# Patient Record
Sex: Female | Born: 1995 | Race: Black or African American | Hispanic: No | Marital: Single | Smoking: Never smoker
Health system: Southern US, Community
[De-identification: ages and names within clinical notes are randomized; demographics above are authoritative.]

## PROBLEM LIST (undated history)

## (undated) ENCOUNTER — Inpatient Hospital Stay (HOSPITAL_COMMUNITY): Payer: Self-pay

## (undated) DIAGNOSIS — D573 Sickle-cell trait: Secondary | ICD-10-CM

## (undated) HISTORY — PX: KNEE SURGERY: SHX244

---
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
## 2013-03-25 ENCOUNTER — Emergency Department (HOSPITAL_COMMUNITY): Payer: BC Managed Care – PPO

## 2013-03-25 ENCOUNTER — Emergency Department (HOSPITAL_COMMUNITY)
Admission: EM | Admit: 2013-03-25 | Discharge: 2013-03-25 | Disposition: A | Discharge status: Home / Self Care | Payer: BC Managed Care – PPO | Attending: Emergency Medicine | Admitting: Emergency Medicine

## 2013-03-25 ENCOUNTER — Encounter (HOSPITAL_COMMUNITY): Payer: Self-pay | Admitting: Family Medicine

## 2013-03-25 DIAGNOSIS — O9989 Other specified diseases and conditions complicating pregnancy, childbirth and the puerperium: Secondary | ICD-10-CM | POA: Insufficient documentation

## 2013-03-25 DIAGNOSIS — Z79899 Other long term (current) drug therapy: Secondary | ICD-10-CM | POA: Insufficient documentation

## 2013-03-25 DIAGNOSIS — R110 Nausea: Secondary | ICD-10-CM | POA: Insufficient documentation

## 2013-03-25 DIAGNOSIS — N9489 Other specified conditions associated with female genital organs and menstrual cycle: Secondary | ICD-10-CM

## 2013-03-25 DIAGNOSIS — N72 Inflammatory disease of cervix uteri: Secondary | ICD-10-CM

## 2013-03-25 DIAGNOSIS — R109 Unspecified abdominal pain: Secondary | ICD-10-CM | POA: Insufficient documentation

## 2013-03-25 DIAGNOSIS — Z3201 Encounter for pregnancy test, result positive: Secondary | ICD-10-CM

## 2013-03-25 DIAGNOSIS — O2390 Unspecified genitourinary tract infection in pregnancy, unspecified trimester: Secondary | ICD-10-CM | POA: Insufficient documentation

## 2013-03-25 LAB — BASIC METABOLIC PANEL
Chloride: 101 mEq/L (ref 96–112)
GFR calc Af Amer: >90 mL/min (ref >90)
Potassium: 3.2 mEq/L — ABNORMAL LOW (ref 3.5–5.1)
Sodium: 134 mEq/L — ABNORMAL LOW (ref 135–145)

## 2013-03-25 LAB — URINALYSIS W MICROSCOPIC + REFLEX CULTURE
Leukocytes, UA: NEGATIVE
Nitrite: NEGATIVE
Specific Gravity, Urine: 1.021 (ref 1.005–1.030)
pH: 6 (ref 5.0–8.0)

## 2013-03-25 LAB — CBC WITH DIFFERENTIAL/PLATELET
Basophils Relative: 0 % (ref 0–1)
Eosinophils Relative: 1 % (ref 0–5)
HCT: 33.8 % — ABNORMAL LOW (ref 36.0–46.0)
Hemoglobin: 12.1 g/dL (ref 12.0–15.0)
Lymphocytes Relative: 41 % (ref 12–46)
MCHC: 35.8 g/dL (ref 30.0–36.0)
MCV: 81.1 fL (ref 78.0–100.0)
Monocytes Absolute: 0.5 10*3/uL (ref 0.1–1.0)
Monocytes Relative: 9 % (ref 3–12)
Neutro Abs: 3.2 10*3/uL (ref 1.7–7.7)

## 2013-03-25 LAB — HCG, QUANTITATIVE, PREGNANCY: hCG, Beta Chain, Quant, S: 400 m[IU]/mL — ABNORMAL HIGH (ref <5)

## 2013-03-25 LAB — POCT PREGNANCY, URINE: Preg Test, Ur: POSITIVE — AB

## 2013-03-25 LAB — WET PREP, GENITAL

## 2013-03-25 MED ORDER — LIDOCAINE HCL 1 % IJ SOLN
INTRAMUSCULAR | Status: AC
  Administered 2013-03-25: 0.9 mL
  Filled 2013-03-25: qty 20

## 2013-03-25 MED ORDER — ONDANSETRON 8 MG PO TBDP
8.0000 mg | ORAL_TABLET | Freq: Once | ORAL | Status: AC
  Administered 2013-03-25: 8 mg via ORAL
  Filled 2013-03-25: qty 1

## 2013-03-25 MED ORDER — CEFTRIAXONE SODIUM 250 MG IJ SOLR
250.0000 mg | Freq: Once | INTRAMUSCULAR | Status: AC
  Administered 2013-03-25: 250 mg via INTRAMUSCULAR
  Filled 2013-03-25: qty 250

## 2013-03-25 MED ORDER — ACETAMINOPHEN 325 MG PO TABS
650.0000 mg | ORAL_TABLET | Freq: Once | ORAL | Status: AC
  Administered 2013-03-25: 650 mg via ORAL
  Filled 2013-03-25: qty 2

## 2013-03-25 MED ORDER — AZITHROMYCIN 250 MG PO TABS
1000.0000 mg | ORAL_TABLET | Freq: Once | ORAL | Status: AC
  Administered 2013-03-25: 1000 mg via ORAL
  Filled 2013-03-25: qty 4

## 2013-03-25 NOTE — ED Notes (Addendum)
Patient states that she has had abdominal pain since 5am. Reports nausea; denies vomiting, diarrhea or fever. Patient states that she went to the health center yesterday at school and found out that she is pregnant. Denies vaginal discharge.

## 2013-03-25 NOTE — ED Provider Notes (Signed)
CSN: [id number]     Arrival date & time 03/25/13  0550 History   First MD Initiated Contact with Patient 03/25/13 [phone]     Chief Complaint  Patient presents with  . Abdominal Pain   (Consider location/radiation/quality/duration/timing/severity/associated sxs/prior Treatment) HPI Patient is a 17 year old female who presents to emergency department complaining of lower abdominal pain. Patient states she had intermittent cramping for the last week however today around 5 AM pain became worse. Patient states that she went to a health department yesterday because she missed her period and she was told that she's pregnant. Patient denies any prior pregnancies. Patient denies taking any medications for this. Patient denies any vaginal discharge or bleeding. Patient denies any fever, chills, malaise, urinary symptoms. Patient states that she has been nauseated for last several days but denies any vomiting or diarrhea. History reviewed. No pertinent past medical history. Past Surgical History  Procedure Laterality Date  . Knee surgery     No family history on file. History  Substance Use Topics  . Smoking status: Never Smoker   . Smokeless tobacco: Not on file  . Alcohol Use: Yes     Comment: Occasional   OB History   Grav Para Term Preterm Abortions TAB SAB Ect Mult Living                 Review of Systems  Constitutional: Negative for fever and chills.  HENT: Negative for neck pain and neck stiffness.   Respiratory: Negative for cough, chest tightness and shortness of breath.   Cardiovascular: Negative for chest pain, palpitations and leg swelling.  Gastrointestinal: Positive for nausea and abdominal pain. Negative for vomiting and diarrhea.  Genitourinary: Positive for pelvic pain. Negative for dysuria, flank pain, vaginal bleeding, vaginal discharge and vaginal pain.  Musculoskeletal: Negative for myalgias and arthralgias.  Skin: Negative for rash.  Neurological: Negative for  dizziness, weakness and headaches.  All other systems reviewed and are negative.    Allergies  Review of patient's allergies indicates no known allergies.  Home Medications   Current Outpatient Rx  Name  Route  Sig  Dispense  Refill  . norgestimate-ethinyl estradiol (ORTHO-CYCLEN,SPRINTEC,PREVIFEM) 0.25-35 MG-MCG tablet   Oral   Take 1 tablet by mouth daily.          BP 124/67  Pulse 95  Temp(Src) 98.4 F (36.9 C) (Oral)  Resp 22  SpO2 100%  LMP 02/15/2013 Physical Exam  Nursing note and vitals reviewed. Constitutional: She is oriented to person, place, and time. She appears well-developed and well-nourished.  HENT:  Head: Normocephalic.  Eyes: Conjunctivae are normal.  Neck: Neck supple.  Cardiovascular: Normal rate, regular rhythm and normal heart sounds.   Pulmonary/Chest: Effort normal and breath sounds normal. No respiratory distress. She has no wheezes. She has no rales.  Abdominal: Soft. Bowel sounds are normal. She exhibits no distension. There is tenderness. There is no rebound.  Suprapubic tenderness  Genitourinary:  Normal external genitalia. White thin vaginal discharge. Cervix is erythematous, closed. CMT present. Left adnexal tenderness.   Neurological: She is alert and oriented to person, place, and time.  Skin: Skin is warm and dry.    ED Course  Procedures (including critical care time)  Medications  acetaminophen (TYLENOL) tablet 650 mg (650 mg Oral Given 03/25/13 0656)  ondansetron (ZOFRAN-ODT) disintegrating tablet 8 mg (8 mg Oral Given 03/25/13 0656)  cefTRIAXone (ROCEPHIN) injection 250 mg (250 mg Intramuscular Given 03/25/13 1100)  azithromycin (ZITHROMAX) tablet 1,000 mg (1,000 mg Oral  Given 03/25/13 1100)  lidocaine (XYLOCAINE) 1 % (with pres) injection (0.9 mLs  Given 03/25/13 1101)   Results for orders placed during the hospital encounter of 03/25/13  WET PREP, GENITAL      Result Value Range   Yeast Wet Prep HPF POC NONE SEEN  NONE SEEN    Trich, Wet Prep NONE SEEN  NONE SEEN   Clue Cells Wet Prep HPF POC NONE SEEN  NONE SEEN   WBC, Wet Prep HPF POC MODERATE (*) NONE SEEN  CBC WITH DIFFERENTIAL      Result Value Range   WBC 6.3  4.0 - 10.5 K/uL   RBC 4.17  3.87 - 5.11 MIL/uL   Hemoglobin 12.1  12.0 - 15.0 g/dL   HCT 10.2 (*) 72.5 - 36.6 %   MCV 81.1  78.0 - 100.0 fL   MCH 29.0  26.0 - 34.0 pg   MCHC 35.8  30.0 - 36.0 g/dL   RDW 44.0  34.7 - 42.5 %   Platelets 229  150 - 400 K/uL   Neutrophils Relative % 50  43 - 77 %   Neutro Abs 3.2  1.7 - 7.7 K/uL   Lymphocytes Relative 41  12 - 46 %   Lymphs Abs 2.6  0.7 - 4.0 K/uL   Monocytes Relative 9  3 - 12 %   Monocytes Absolute 0.5  0.1 - 1.0 K/uL   Eosinophils Relative 1  0 - 5 %   Eosinophils Absolute 0.0  0.0 - 0.7 K/uL   Basophils Relative 0  0 - 1 %   Basophils Absolute 0.0  0.0 - 0.1 K/uL  HCG, QUANTITATIVE, PREGNANCY      Result Value Range   hCG, Beta Chain, Quant, S 400 (*) <5 mIU/mL  URINALYSIS W MICROSCOPIC + REFLEX CULTURE      Result Value Range   Color, Urine YELLOW  YELLOW   APPearance CLEAR  CLEAR   Specific Gravity, Urine 1.021  1.005 - 1.030   pH 6.0  5.0 - 8.0   Glucose, UA NEGATIVE  NEGATIVE mg/dL   Hgb urine dipstick NEGATIVE  NEGATIVE   Bilirubin Urine NEGATIVE  NEGATIVE   Ketones, ur NEGATIVE  NEGATIVE mg/dL   Protein, ur NEGATIVE  NEGATIVE mg/dL   Urobilinogen, UA 1.0  0.0 - 1.0 mg/dL   Nitrite NEGATIVE  NEGATIVE   Leukocytes, UA NEGATIVE  NEGATIVE   WBC, UA 0-2  <3 WBC/hpf   RBC / HPF 0-2  <3 RBC/hpf   Bacteria, UA RARE  RARE   Squamous Epithelial / LPF RARE  RARE  BASIC METABOLIC PANEL      Result Value Range   Sodium 134 (*) 135 - 145 mEq/L   Potassium 3.2 (*) 3.5 - 5.1 mEq/L   Chloride 101  96 - 112 mEq/L   CO2 23  19 - 32 mEq/L   Glucose, Bld 101 (*) 70 - 99 mg/dL   BUN 8  6 - 23 mg/dL   Creatinine, Ser 9.56  0.50 - 1.10 mg/dL   Calcium 8.9  8.4 - 38.7 mg/dL   GFR calc non Af Amer >90  >90 mL/min   GFR calc Af Amer >90   >90 mL/min  POCT PREGNANCY, URINE      Result Value Range   Preg Test, Ur POSITIVE (*) NEGATIVE  ABO/RH      Result Value Range   ABO/RH(D) A POS     No rh immune globuloin NOT A RH  IMMUNE GLOBULIN CANDIDATE, PT RH POSITIVE     US Ob Transvaginal  03/25/2013   *RADIOLOGY REPORT*  Clinical Data: Patient with abdominal pain.  Rule out ectopic pregnancy.  OBSTETRIC <14 WK ULTRASOUND  Technique:  Transabdominal ultrasound was performed for evaluation of the gestation as well as the maternal uterus and adnexal regions.  Comparison:  None  Findings: No intrauterine pregnancy is identified.  There is mixed echogenicity material demonstrated within the endometrial cavity, suggestive of a small amount of blood products.  There is a small to moderate amount of complicated fluid within the pelvis.  No definite fluid extending up to the right or left upper quadrants.  The left ovary measures 3.3 x 1.6 x 3.0 cm.  There is a probable left corpus luteum which measures 2.2 x 2.4 x 1.3 cm.  The right ovary measures 2.6 x 1.1 x 1.1 cm.  Within the left adnexa there is a 4.2 x 1.8 cm heterogenous mass with a somewhat tubular-shape which appears to be separate from the left ovary.  IMPRESSION:  1. Suggestion of a complex mass within the left adnexa with a small to moderate amount of free fluid in the pelvis.  These findings are suspicious for ectopic pregnancy.  No intrauterine pregnancy is identified.  2. Mixed echogenicity material within endometrial canal which may represent blood products.  These results were called by telephone on 03/25/2013 at 10:00 a.m. to [facility] [place], who verbally acknowledged these results.   Original Report Authenticated By: Annia Belt, M.D    11:04 AM Spoke with Dr. Debroah Loop from [facility]. Advised given result findings, recheck hcg in 2 days. Verified to make sure pt does not need to be seen emergently seen by GYN or admitted. Dr. Debroah Loop stated she does not need to be seen for 2  days unless symptoms worsening or any new concerning symptoms.    MDM   1. Positive pregnancy test   2. Adnexal mass   3. Cervicitis     Pt with positive pregnancy test, suprapubic cramping and left adnexal pain on exam. US obtained to r/o ectopic and showed a complex mass within left adnexa, no intrauterine pregnancy see. This is suspicious for possible ectopic pregnancy, however given her hCG is only 400 this is not definite. Patient was given Tylenol for her pain in the ER. Patient is completely pain-free at this time. She had no vaginal bleeding on exam. She did have a friable cervix with positive cervical motion tenderness. I have covered her with Rocephin 250 mg IM and Zithromax 1 g by mouth per Dr. Olivia Mackie recommendation. I discussed patient and on the results findings with Dr. Debroah Loop who is the OB/GYN on call who recommended patient be discharged home with followup in 2 days for hCG recheck. Given patient's has normal vital signs at this time, no pain, she will be discharged home with Tylenol for pain and continue prenatal vitamins. She is [facility] for recheck of her hCG. If she has an increased pain, vaginal bleeding, dizziness, increased pain, or any new concerning symptoms she is to go to [facility] hospital. Patient voiced understanding.  Filed Vitals:   03/25/13 0554  BP: 124/67  Pulse: 95  Temp: 98.4 F (36.9 C)  TempSrc: Oral  Resp: 22  SpO2: 100%       Finbar Nippert A Piya Mesch, PA-C 03/25/13 1612

## 2013-03-26 NOTE — ED Provider Notes (Signed)
Medical screening examination/treatment/procedure(s) were performed by non-physician practitioner and as supervising physician I was immediately available for consultation/collaboration.   Ezme Duch, MD 03/26/13 1359 

## 2013-03-27 ENCOUNTER — Encounter (HOSPITAL_COMMUNITY): Payer: Self-pay

## 2013-03-27 ENCOUNTER — Inpatient Hospital Stay (HOSPITAL_COMMUNITY)
Admission: AD | Admit: 2013-03-27 | Discharge: 2013-03-27 | Disposition: A | Discharge status: Home / Self Care | Payer: BC Managed Care – PPO | Source: Ambulatory Visit | Attending: Obstetrics & Gynecology | Admitting: Obstetrics & Gynecology

## 2013-03-27 DIAGNOSIS — O039 Complete or unspecified spontaneous abortion without complication: Secondary | ICD-10-CM

## 2013-03-27 LAB — HCG, QUANTITATIVE, PREGNANCY: hCG, Beta Chain, Quant, S: 108 m[IU]/mL — ABNORMAL HIGH (ref <5)

## 2013-03-27 NOTE — MAU Provider Note (Signed)
S: Pt was seen at [facility]  on 03/25/13 for ? Ectopic pregnancy. She did have pain, no bleeding. Dr Debroah Loop was called advised to follow BHCG down. She denies any pain/ bleeding today.  O: Alert, oriented, NAD  Labs 9/12 BHCG 400          9/14 BHCG 108  A: SAB  P: Spoke with Dr Despina Hidden who advised no ultrasound necessary, just follow down quants closely. She will return Tuesday for repeat quant

## 2013-03-27 NOTE — MAU Note (Signed)
Patient to MAU for follow up BHCG. Patient was seen at [facility] on 9-12 and instructed to return to MAU. Denies pain or bleeding.

## 2013-03-29 ENCOUNTER — Inpatient Hospital Stay (HOSPITAL_COMMUNITY)
Admission: AD | Admit: 2013-03-29 | Discharge: 2013-03-29 | Disposition: A | Discharge status: Home / Self Care | Payer: BC Managed Care – PPO | Source: Ambulatory Visit | Attending: Obstetrics & Gynecology | Admitting: Obstetrics & Gynecology

## 2013-03-29 DIAGNOSIS — O00109 Unspecified tubal pregnancy without intrauterine pregnancy: Secondary | ICD-10-CM | POA: Insufficient documentation

## 2013-03-29 DIAGNOSIS — O009 Unspecified ectopic pregnancy without intrauterine pregnancy: Secondary | ICD-10-CM

## 2013-03-29 DIAGNOSIS — R109 Unspecified abdominal pain: Secondary | ICD-10-CM | POA: Insufficient documentation

## 2013-03-29 MED ORDER — IBUPROFEN 600 MG PO TABS
600.0000 mg | ORAL_TABLET | Freq: Once | ORAL | Status: AC
  Administered 2013-03-29: 600 mg via ORAL
  Filled 2013-03-29 (×2): qty 1

## 2013-03-29 MED ORDER — IBUPROFEN 600 MG PO TABS: 600.0000 mg | ORAL_TABLET | Freq: Once | ORAL | Status: DC

## 2013-03-29 MED ORDER — IBUPROFEN 600 MG PO TABS: 600.0000 mg | ORAL_TABLET | Freq: Four times a day (QID) | ORAL | Status: DC | PRN

## 2013-03-29 NOTE — MAU Provider Note (Signed)
History   Chief Complaint:  Follow-up  Pt reports continued bleeding, has used 4 overnight pads today. Also reports cramping that is a little worse than her normal period cramps   Tammie Mckee is  17 y.o. G1P0 Patient's last menstrual period was 02/15/2013.Marland Kitchen Patient is here for follow up of quantitative HCG and ongoing surveillance of pregnancy status. She was diagnosed with a left ectopic pregnancy 03/25/2013 by ultrasound and has had a drop in hCGs spontaneously.    Since her last visit, the patient is with new complaint.   The patient reports bleeding as  similar to period.  Mild to moderate cramping but did not resolve with Tylenol.  General ROS: Denies fever, chills, dizziness, weakness.   Her previous Quantitative HCG values are:  Results for Tammie, Mckee (MRN [id number]) as of 03/29/2013 20:34  Ref. Range 03/25/2013 07:05 03/27/2013 12:05  hCG, Beta Chain, Quant, S Latest Range: <5 mIU/mL 400 (H) 108 (H)     Physical Exam   Blood pressure 132/79, pulse 59, temperature 98.1 F (36.7 C), temperature source Oral, resp. rate 16, last menstrual period 02/15/2013, SpO2 100.00%.  Focused Gynecological Exam: exam declined by the patient. Patient is in no apparent distress.  Labs: Results for orders placed during the hospital encounter of 03/29/13 (from the past 24 hour(s))  HCG, QUANTITATIVE, PREGNANCY   Collection Time    03/29/13  7:50 PM      Result Value Range   hCG, Beta Chain, Quant, S 46 (*) <5 mIU/mL    Ultrasound Studies:   N/A   Assessment: Spontaneously resolving left ectopic pregnancy with appropriately drop in hCGs.   Plan: The patient is instructed to follow up for weekly hCGs at [facility] hospital outpatient clinic until less than 2 and in 2 weeks for a provider visit followup appointment. Ectopic precautions. Support given.   Medication List         ibuprofen 600 MG tablet  Commonly known as:  ADVIL,MOTRIN  Take 1 tablet (600 mg total) by mouth  every 6 (six) hours as needed for pain.     ibuprofen 600 MG tablet  Commonly known as:  ADVIL,MOTRIN  Take 1 tablet (600 mg total) by mouth once.     norgestimate-ethinyl estradiol 0.25-35 MG-MCG tablet  Commonly known as:  ORTHO-CYCLEN,SPRINTEC,PREVIFEM  Take 1 tablet by mouth daily.       Tammie Mckee 03/29/2013, 8:59 PM

## 2013-03-29 NOTE — MAU Note (Signed)
Pt reports continued bleeding, has used 4 overnight pads today. Also reports cramping that is a little worse than her normal period cramps.

## 2013-04-05 ENCOUNTER — Other Ambulatory Visit: Payer: BC Managed Care – PPO

## 2013-04-05 DIAGNOSIS — O009 Unspecified ectopic pregnancy without intrauterine pregnancy: Secondary | ICD-10-CM

## 2013-04-09 NOTE — MAU Provider Note (Signed)
Attestation of Attending Supervision of Advanced Practitioner (CNM/NP): Evaluation and management procedures were performed by the Advanced Practitioner under my supervision and collaboration. I have reviewed the Advanced Practitioner's note and chart, and I agree with the management and plan.  Tammie Capri H. 6:12 PM

## 2013-04-13 ENCOUNTER — Encounter: Payer: BC Managed Care – PPO | Admitting: Nurse Practitioner

## 2013-04-18 ENCOUNTER — Ambulatory Visit (INDEPENDENT_AMBULATORY_CARE_PROVIDER_SITE_OTHER): Payer: BC Managed Care – PPO | Admitting: Obstetrics & Gynecology

## 2013-04-18 ENCOUNTER — Encounter: Payer: Self-pay | Admitting: Nurse Practitioner

## 2013-04-18 VITALS — BP 119/63 | HR 63 | Temp 96.9°F | Ht 63.0 in | Wt 177.0 lb

## 2013-04-18 DIAGNOSIS — O009 Unspecified ectopic pregnancy without intrauterine pregnancy: Secondary | ICD-10-CM

## 2013-04-18 MED ORDER — NORGESTIM-ETH ESTRAD TRIPHASIC 0.18/0.215/0.25 MG-35 MCG PO TABS: 1.0000 | ORAL_TABLET | Freq: Every day | ORAL | Status: DC

## 2013-04-18 NOTE — Progress Notes (Signed)
  Subjective:    Patient ID: Tammie Mckee, female    DOB: 13-Nov-1995, 17 y.o.   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 She has no complaints and her QBHCG is now less than 2. We discussed contraception. She uses condoms faithfully and would like to restart ortho tri cyclen.  Review of Systems     Objective:   Physical Exam        Assessment & Plan:  As above

## 2013-04-18 NOTE — Patient Instructions (Signed)

## 2013-10-08 ENCOUNTER — Emergency Department (HOSPITAL_COMMUNITY)
Admission: EM | Admit: 2013-10-08 | Discharge: 2013-10-08 | Disposition: A | Discharge status: Home / Self Care | Payer: BC Managed Care – PPO | Attending: Emergency Medicine | Admitting: Emergency Medicine

## 2013-10-08 ENCOUNTER — Encounter (HOSPITAL_COMMUNITY): Payer: Self-pay | Admitting: Emergency Medicine

## 2013-10-08 DIAGNOSIS — K3189 Other diseases of stomach and duodenum: Secondary | ICD-10-CM | POA: Insufficient documentation

## 2013-10-08 DIAGNOSIS — Z79899 Other long term (current) drug therapy: Secondary | ICD-10-CM | POA: Insufficient documentation

## 2013-10-08 DIAGNOSIS — Z3202 Encounter for pregnancy test, result negative: Secondary | ICD-10-CM | POA: Insufficient documentation

## 2013-10-08 DIAGNOSIS — K30 Functional dyspepsia: Secondary | ICD-10-CM

## 2013-10-08 DIAGNOSIS — R12 Heartburn: Secondary | ICD-10-CM | POA: Insufficient documentation

## 2013-10-08 DIAGNOSIS — R079 Chest pain, unspecified: Secondary | ICD-10-CM | POA: Insufficient documentation

## 2013-10-08 DIAGNOSIS — R1013 Epigastric pain: Secondary | ICD-10-CM

## 2013-10-08 DIAGNOSIS — R112 Nausea with vomiting, unspecified: Secondary | ICD-10-CM

## 2013-10-08 LAB — URINALYSIS, ROUTINE W REFLEX MICROSCOPIC
Bilirubin Urine: NEGATIVE
Glucose, UA: NEGATIVE mg/dL
HGB URINE DIPSTICK: NEGATIVE
Ketones, ur: NEGATIVE mg/dL
LEUKOCYTES UA: NEGATIVE
NITRITE: NEGATIVE
PROTEIN: NEGATIVE mg/dL
SPECIFIC GRAVITY, URINE: 1.013 (ref 1.005–1.030)
UROBILINOGEN UA: 0.2 mg/dL (ref 0.0–1.0)
pH: 5 (ref 5.0–8.0)

## 2013-10-08 LAB — PREGNANCY, URINE: Preg Test, Ur: NEGATIVE

## 2013-10-08 NOTE — ED Provider Notes (Signed)
Medical screening examination/treatment/procedure(s) were performed by non-physician practitioner and as supervising physician I was immediately available for consultation/collaboration.   EKG Interpretation None        Dagmawi Venable T Dalten Ambrosino, MD 10/08/13 1520 

## 2013-10-08 NOTE — ED Notes (Signed)
Pt states she threw up 2x this am. No diarrhea, but pt states she has abd cramping. Pt states states yesterday she had some heart burn.

## 2013-10-08 NOTE — Discharge Instructions (Signed)
Read the information below.  Use the prescribed medication as directed.  Please discuss all new medications with your pharmacist.  You may return to the Emergency Department at any time for worsening condition or any new symptoms that concern you.  If you develop high fevers, worsening abdominal or chest pain, uncontrolled vomiting, or are unable to tolerate fluids by mouth, return to the ER for a recheck.      Nausea and Vomiting Nausea is a sick feeling that often comes before throwing up (vomiting). Vomiting is a reflex where stomach contents come out of your mouth. Vomiting can cause severe loss of body fluids (dehydration). Children and elderly adults can become dehydrated quickly, especially if they also have diarrhea. Nausea and vomiting are symptoms of a condition or disease. It is important to find the cause of your symptoms. CAUSES   Direct irritation of the stomach lining. This irritation can result from increased acid production (gastroesophageal reflux disease), infection, food poisoning, taking certain medicines (such as nonsteroidal anti-inflammatory drugs), alcohol use, or tobacco use.  Signals from the brain.These signals could be caused by a headache, heat exposure, an inner ear disturbance, increased pressure in the brain from injury, infection, a tumor, or a concussion, pain, emotional stimulus, or metabolic problems.  An obstruction in the gastrointestinal tract (bowel obstruction).  Illnesses such as diabetes, hepatitis, gallbladder problems, appendicitis, kidney problems, cancer, sepsis, atypical symptoms of a heart attack, or eating disorders.  Medical treatments such as chemotherapy and radiation.  Receiving medicine that makes you sleep (general anesthetic) during surgery. DIAGNOSIS Your caregiver may ask for tests to be done if the problems do not improve after a few days. Tests may also be done if symptoms are severe or if the reason for the nausea and vomiting is not  clear. Tests may include:  Urine tests.  Blood tests.  Stool tests.  Cultures (to look for evidence of infection).  X-rays or other imaging studies. Test results can help your caregiver make decisions about treatment or the need for additional tests. TREATMENT You need to stay well hydrated. Drink frequently but in small amounts.You may wish to drink water, sports drinks, clear broth, or eat frozen ice pops or gelatin dessert to help stay hydrated.When you eat, eating slowly may help prevent nausea.There are also some antinausea medicines that may help prevent nausea. HOME CARE INSTRUCTIONS   Take all medicine as directed by your caregiver.  If you do not have an appetite, do not force yourself to eat. However, you must continue to drink fluids.  If you have an appetite, eat a normal diet unless your caregiver tells you differently.  Eat a variety of complex carbohydrates (rice, wheat, potatoes, bread), lean meats, yogurt, fruits, and vegetables.  Avoid high-fat foods because they are more difficult to digest.  Drink enough water and fluids to keep your urine clear or pale yellow.  If you are dehydrated, ask your caregiver for specific rehydration instructions. Signs of dehydration may include:  Severe thirst.  Dry lips and mouth.  Dizziness.  Dark urine.  Decreasing urine frequency and amount.  Confusion.  Rapid breathing or pulse. SEEK IMMEDIATE MEDICAL CARE IF:   You have blood or brown flecks (like coffee grounds) in your vomit.  You have black or bloody stools.  You have a severe headache or stiff neck.  You are confused.  You have severe abdominal pain.  You have chest pain or trouble breathing.  You do not urinate at least once every  8 hours.  You develop cold or clammy skin.  You continue to vomit for longer than 24 to 48 hours.  You have a fever. MAKE SURE YOU:   Understand these instructions.  Will watch your condition.  Will get help  right away if you are not doing well or get worse. Document Released: 06/30/2005 Document Revised: 09/22/2011 Document Reviewed: 11/27/2010 [facility] Patient Information 2014 [employer], [place].

## 2013-10-08 NOTE — ED Provider Notes (Signed)
CSN: [id number]     Arrival date & time 10/08/13  1231 History   First MD Initiated Contact with Patient 10/08/13 1301     Chief Complaint  Patient presents with  . Emesis     (Consider location/radiation/quality/duration/timing/severity/associated sxs/prior Treatment) HPI Patient states she woke up this morning with heartburn, described as burning in her central chest that lasted 1 hour, followed by two episodes of N/V.  Since then she has been feeling much better but feels that her stomach is still "rumbling" and somewhat upset, as if she is about to have diarrhea.  Last BM was yesterday and was normal, no diarrhea, constipation, blood.  Denies urinary or vaginal symptoms.  LMP was last week and was normal and on time.   States she ate a burger, sweet potato fries, and cheese fries yesterday, which is unusual for her.  States she did have a short episode of heartburn yesterday as well, lasting approximately 30 seconds.  No recent travel.  No known sick contacts.  No hx abdominal surgery.   History reviewed. No pertinent past medical history. Past Surgical History  Procedure Laterality Date  . Knee surgery     History reviewed. No pertinent family history. History  Substance Use Topics  . Smoking status: Never Smoker   . Smokeless tobacco: Not on file  . Alcohol Use: Yes     Comment: Occasional   OB History   Grav Para Term Preterm Abortions TAB SAB Ect Mult Living   1    1  1         Review of Systems  Constitutional: Negative for fever.  Respiratory: Negative for cough and shortness of breath.   Cardiovascular: Positive for chest pain.  Gastrointestinal: Positive for nausea and vomiting. Negative for abdominal pain and diarrhea.  Genitourinary: Negative for dysuria, urgency, frequency, vaginal bleeding and vaginal discharge.  All other systems reviewed and are negative.      Allergies  Review of patient's allergies indicates no known allergies.  Home Medications    Current Outpatient Rx  Name  Route  Sig  Dispense  Refill  . ibuprofen (ADVIL,MOTRIN) 800 MG tablet   Oral   Take 800 mg by mouth every 8 (eight) hours as needed for moderate pain.         . Norgestimate-Ethinyl Estradiol Triphasic 0.18/0.215/0.25 MG-35 MCG tablet   Oral   Take 1 tablet by mouth daily.   1 Package   11    BP 123/64  Pulse 91  Temp(Src) 98.9 F (37.2 C) (Oral)  Resp 16  SpO2 100% Physical Exam  Nursing note and vitals reviewed. Constitutional: She appears well-developed and well-nourished. No distress.  HENT:  Head: Normocephalic and atraumatic.  Neck: Neck supple.  Cardiovascular: Normal rate and regular rhythm.   Pulmonary/Chest: Effort normal and breath sounds normal. No respiratory distress. She has no wheezes. She has no rales.  Abdominal: Soft. She exhibits no distension. There is no tenderness. There is no rebound and no guarding.  Neurological: She is alert.  Skin: She is not diaphoretic.  Psychiatric: She has a normal mood and affect. Her behavior is normal.    ED Course  Procedures (including critical care time) Labs Review Labs Reviewed  URINALYSIS, ROUTINE W REFLEX MICROSCOPIC - Abnormal; Notable for the following:    APPearance CLOUDY (*)    All other components within normal limits  PREGNANCY, URINE   Imaging Review No results found.   EKG Interpretation None  MDM   Final diagnoses:  Nausea and vomiting  Indigestion    Pt with isolated episode of heartburn, N/V this morning after eating heavy meal of hamburgers and two types of fries last night - this is an unusual meal for her.  She is completely asymptomatic in ED.  Abdominal exam is normal, nontender.  D/C home.  Discussed result, findings, treatment, and follow up  with patient.  Pt given return precautions.  Pt verbalizes understanding and agrees with plan.        Trixie Dredgemily Sheila Gervasi, PA-C 10/08/13 1420

## 2013-12-04 IMAGING — US US OB TRANSVAGINAL
1 series · 13 of 28 positions shown · non-contrast
Comparison: None

CLINICAL DATA: Patient with abdominal pain.  Rule out ectopic
pregnancy.

OBSTETRIC <14 WK ULTRASOUND
TECHNIQUE: Transabdominal ultrasound was performed for evaluation
of the gestation as well as the maternal uterus and adnexal
regions.

[Series 1: us ob transvaginal · 0.15mm/px · 13 of 100 slices shown]
[im 4/100]
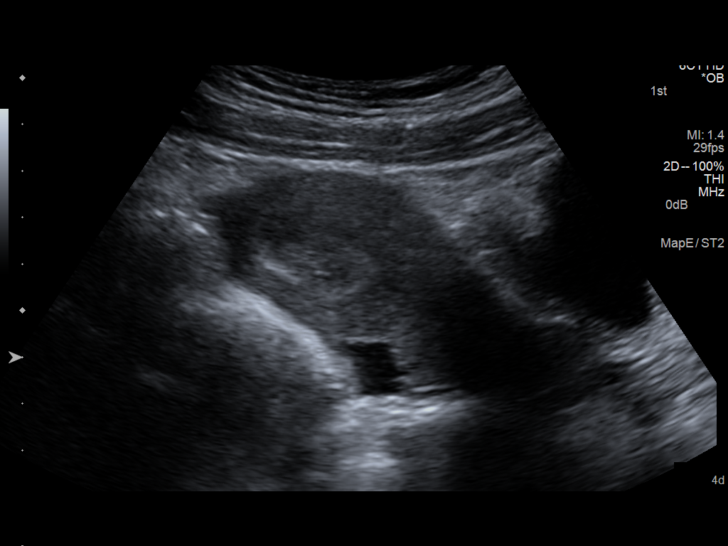
[im 12/100]
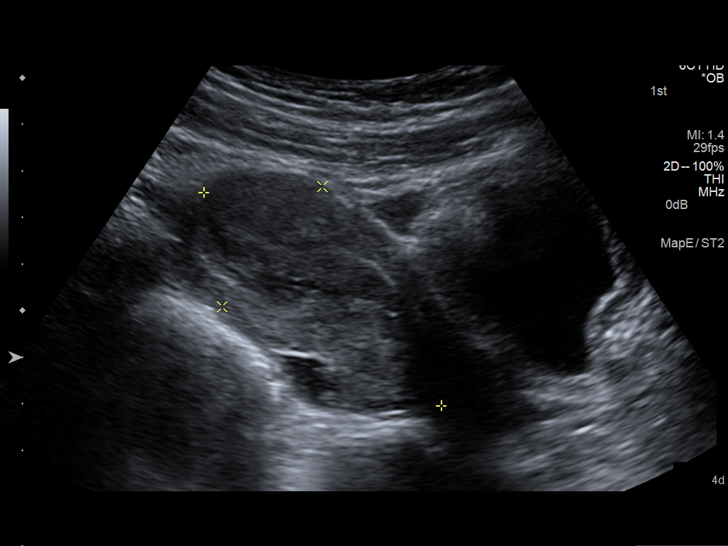
[im 19/100]
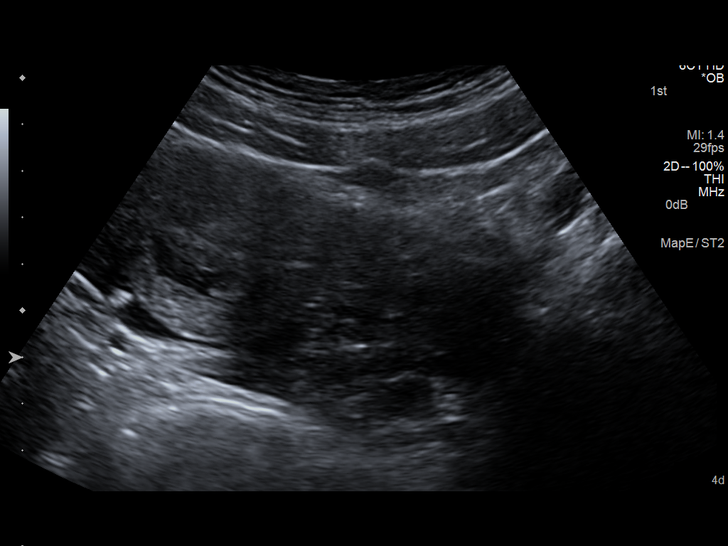
[im 26/100]
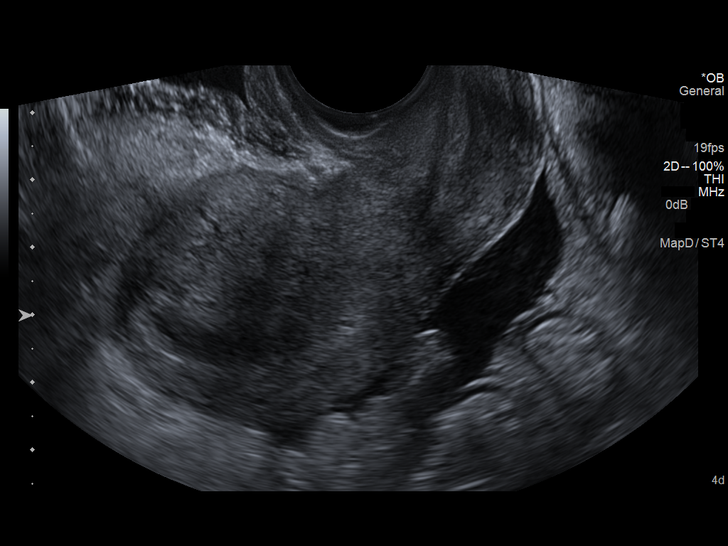
[im 34/100]
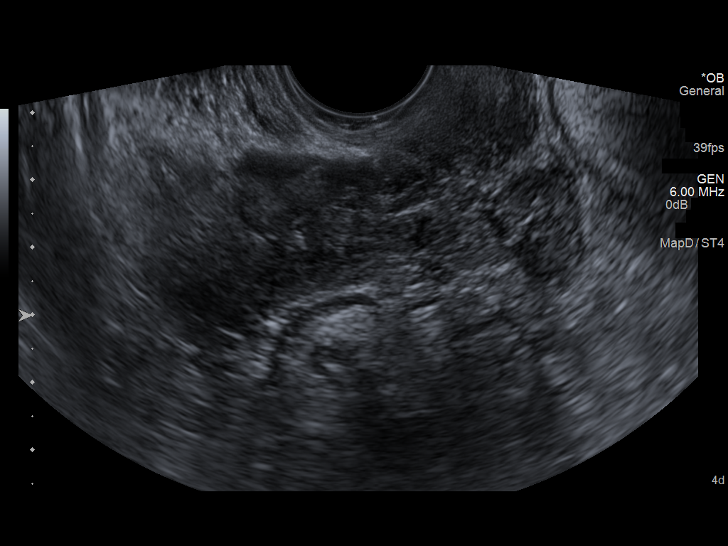
[im 41/100]
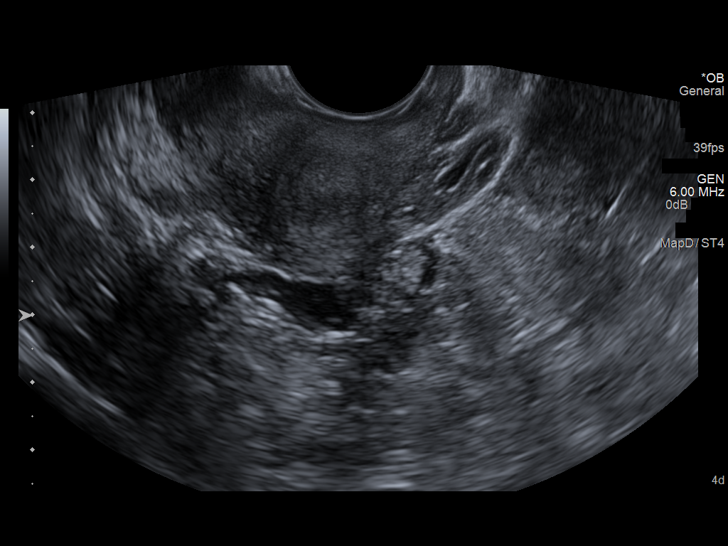
[im 52/100]
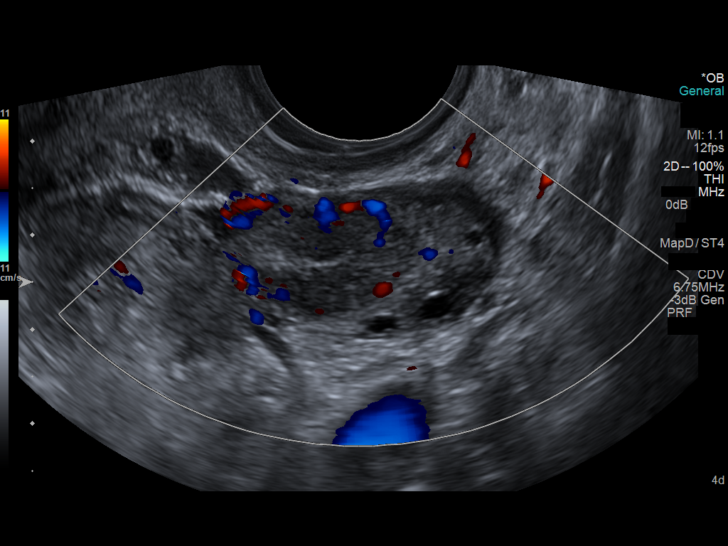
[im 59/100]
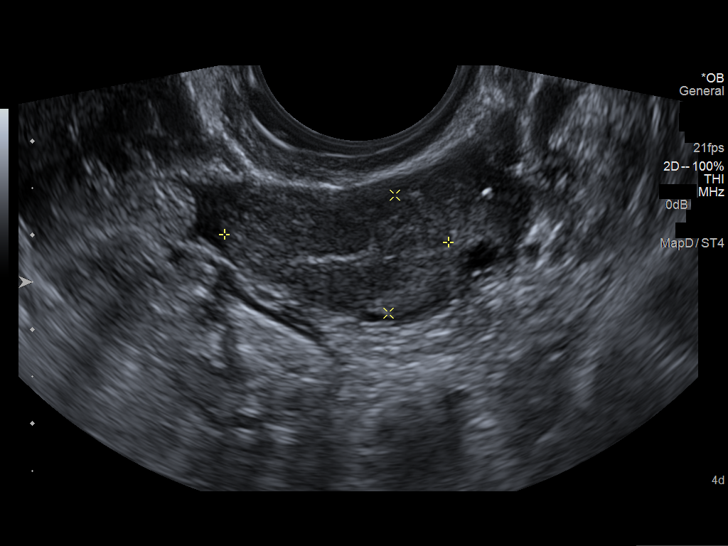
[im 67/100]
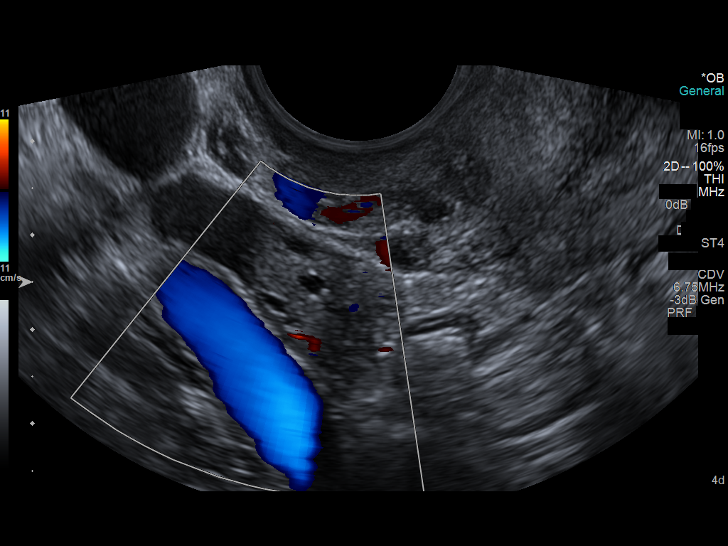
[im 74/100]
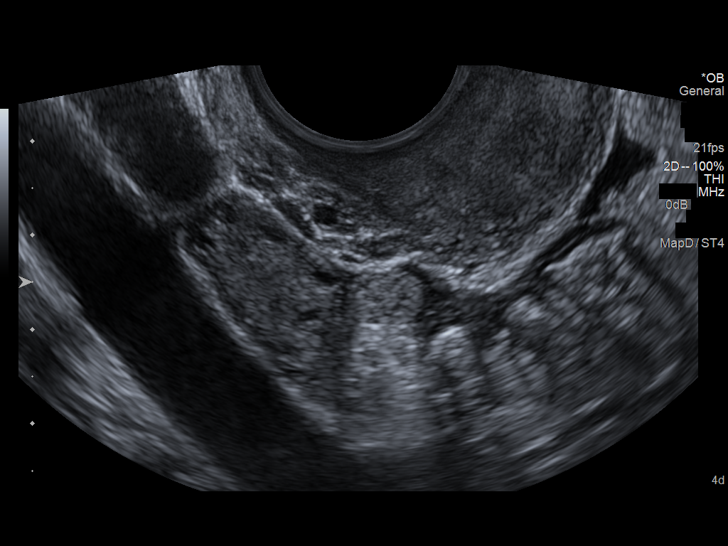
[im 81/100]
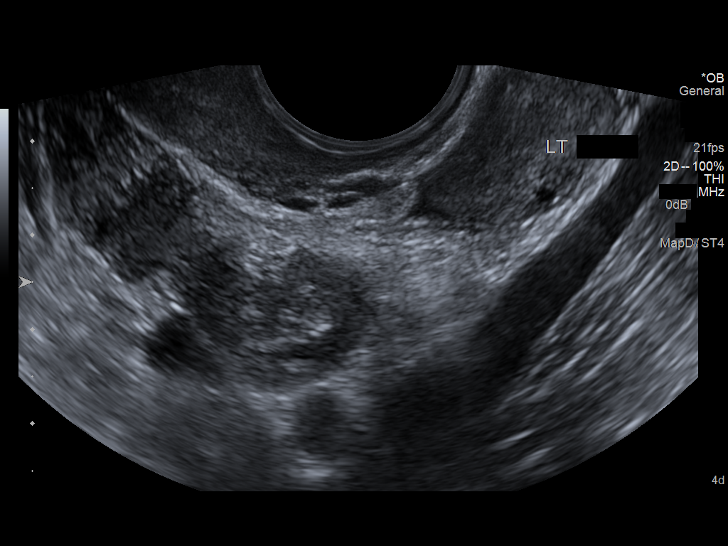
[im 89/100]
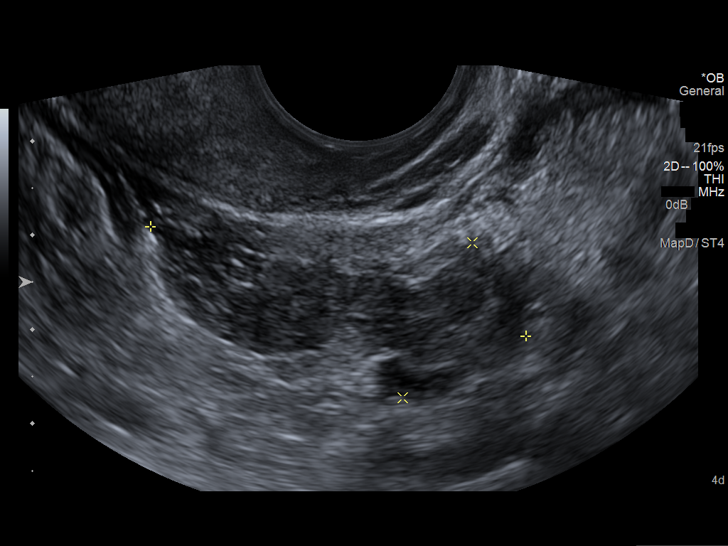
[im 96/100]
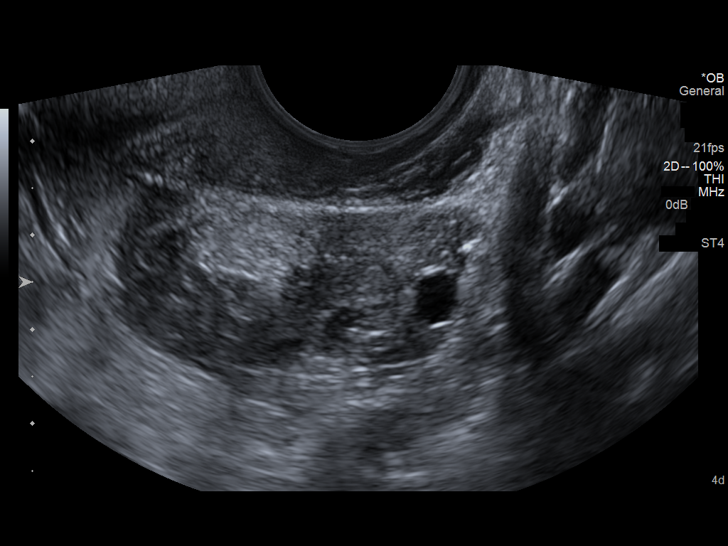

[13 of 28 positions shown; findings below may reference images not displayed]

FINDINGS: No intrauterine pregnancy is identified.  There is mixed
echogenicity material demonstrated within the endometrial cavity,
suggestive of a small amount of blood products.  There is a small
to moderate amount of complicated fluid within the pelvis.  No
definite fluid extending up to the right or left upper quadrants.

The left ovary measures 3.3 x 1.6 x 3.0 cm.  There is a probable
left corpus luteum which measures 2.2 x 2.4 x 1.3 cm.  The right
ovary measures 2.6 x 1.1 x 1.1 cm.

Within the left adnexa there is a 4.2 x 1.8 cm heterogenous mass
with a somewhat tubular-shape which appears to be separate from the
left ovary.
IMPRESSION: 1. Suggestion of a complex mass within the left adnexa with a small
to moderate amount of free fluid in the pelvis.  These findings are
suspicious for ectopic pregnancy.  No intrauterine pregnancy is
identified.

2. Mixed echogenicity material within endometrial canal which may
represent blood products.

to Lucien Roja, PA, who verbally acknowledged these results.

## 2014-05-15 ENCOUNTER — Encounter (HOSPITAL_COMMUNITY): Payer: Self-pay | Admitting: Emergency Medicine

## 2016-01-23 ENCOUNTER — Emergency Department (HOSPITAL_COMMUNITY)
Admission: EM | Admit: 2016-01-23 | Discharge: 2016-01-24 | Disposition: A | Discharge status: Home / Self Care | Payer: Self-pay | Attending: Emergency Medicine | Admitting: Emergency Medicine

## 2016-01-23 ENCOUNTER — Encounter (HOSPITAL_COMMUNITY): Payer: Self-pay | Admitting: Emergency Medicine

## 2016-01-23 DIAGNOSIS — R0602 Shortness of breath: Secondary | ICD-10-CM | POA: Insufficient documentation

## 2016-01-23 DIAGNOSIS — Z3A01 Less than 8 weeks gestation of pregnancy: Secondary | ICD-10-CM | POA: Insufficient documentation

## 2016-01-23 DIAGNOSIS — O26891 Other specified pregnancy related conditions, first trimester: Secondary | ICD-10-CM | POA: Insufficient documentation

## 2016-01-23 DIAGNOSIS — O99511 Diseases of the respiratory system complicating pregnancy, first trimester: Secondary | ICD-10-CM | POA: Insufficient documentation

## 2016-01-23 DIAGNOSIS — O26899 Other specified pregnancy related conditions, unspecified trimester: Secondary | ICD-10-CM

## 2016-01-23 DIAGNOSIS — Z3490 Encounter for supervision of normal pregnancy, unspecified, unspecified trimester: Secondary | ICD-10-CM

## 2016-01-23 DIAGNOSIS — R102 Pelvic and perineal pain: Secondary | ICD-10-CM | POA: Insufficient documentation

## 2016-01-23 LAB — I-STAT BETA HCG BLOOD, ED (MC, WL, AP ONLY): I-stat hCG, quantitative: >2000 m[IU]/mL — ABNORMAL HIGH (ref <5)

## 2016-01-23 LAB — PREGNANCY, URINE: Preg Test, Ur: POSITIVE — AB

## 2016-01-23 NOTE — ED Provider Notes (Signed)
CSN: [id number]     Arrival date & time 01/23/16  1601 History   First MD Initiated Contact with Patient 01/23/16 2200     Chief Complaint  Patient presents with  . Shortness of Breath  . Possible Pregnancy     (Consider location/radiation/quality/duration/timing/severity/associated sxs/prior Treatment) HPI   Tammie Mckee is a 20 y.o. female who presents for evaluation of shortness of breath, missed period, and lower abdominal discomfort. She denies vaginal bleeding, weakness or dizziness. Last menstrual period was about 6 weeks ago. She has a prior history of ectopic pregnancy. She denies chest pain, fever, chills, cough, nausea or vomiting. There are no other known modifying factors.   History reviewed. No pertinent past medical history. Past Surgical History  Procedure Laterality Date  . Knee surgery     No family history on file. Social History  Substance Use Topics  . Smoking status: Never Smoker   . Smokeless tobacco: None  . Alcohol Use: Yes     Comment: Occasional   OB History    Gravida Para Term Preterm AB TAB SAB Ectopic Multiple Living   Review of Systems  All other systems reviewed and are negative.     Allergies  Review of patient's allergies indicates no known allergies.  Home Medications   Prior to Admission medications   Medication Sig Start Date End Date Taking? Authorizing Provider  Norgestimate-Ethinyl Estradiol Triphasic 0.18/0.215/0.25 MG-35 MCG tablet Take 1 tablet by mouth daily. Patient not taking: Reported on 01/23/2016 04/18/13   [name], MD   BP 121/61 mmHg  Pulse 78  Temp(Src) 99 F (37.2 C) (Oral)  Resp 14  Ht  (1.626 m)  Wt 200 lb (90.719 kg)  BMI 34.31 kg/m2  SpO2 100%  LMP 12/09/2015 Physical Exam  Constitutional: She is oriented to person, place, and time. She appears well-developed and well-nourished. No distress.  HENT:  Head: Normocephalic and atraumatic.  Right Ear: External ear normal.   Left Ear: External ear normal.  Eyes: Conjunctivae and EOM are normal. Pupils are equal, round, and reactive to light.  Neck: Normal range of motion and phonation normal. Neck supple.  Cardiovascular: Normal rate, regular rhythm and normal heart sounds.   Pulmonary/Chest: Effort normal and breath sounds normal. No respiratory distress. She has no wheezes. She exhibits no tenderness and no bony tenderness.  Abdominal: Soft. She exhibits no distension and no mass. There is no tenderness. There is no guarding.  Musculoskeletal: Normal range of motion.  Neurological: She is alert and oriented to person, place, and time. No cranial nerve deficit or sensory deficit. She exhibits normal muscle tone. Coordination normal.  Skin: Skin is warm, dry and intact.  Psychiatric: She has a normal mood and affect. Her behavior is normal. Judgment and thought content normal.  Nursing note and vitals reviewed.   ED Course  Procedures (including critical care time)  Medications - No data to display  Patient Vitals for the past 24 hrs:  BP Temp Temp src Pulse Resp SpO2 Height Weight  01/23/16 1848 121/61 mmHg 99 F (37.2 C) Oral 78 14 100 % - -  01/23/16 1614 134/63 mmHg 98.7 F (37.1 C) Oral 70 15 100 %  (1.626 m) 200 lb (90.719 kg)    Abdominal ultrasound ordered to evaluate for ectopic pregnancy   Labs Review Labs Reviewed  PREGNANCY, URINE - Abnormal; Notable for the following:  Preg Test, Ur POSITIVE (*)    All other components within normal limits  HCG, QUANTITATIVE, PREGNANCY  I-STAT BETA HCG BLOOD, ED ([facility], [facility], [facility] ONLY)    Imaging Review No results found. I have personally reviewed and evaluated these images and lab results as part of my medical decision-making.   EKG Interpretation   Date/Time:  Wednesday January 23 2016 16:32:29 EDT Ventricular Rate:  67 PR Interval:    QRS Duration: 83 QT Interval:  414 QTC Calculation: 437 R Axis:   68 Text Interpretation:  Sinus rhythm  Baseline wander in lead(s) I No old  tracing to compare Confirmed by [facility]  MD, Monico Sudduth [phone]([id number]) on 01/23/2016  4:36:34 PM      MDM   Final diagnoses:  Pregnancy    Early pregnancy, with lower abdominal discomfort. Ultrasound imaging ordered.  Nursing Notes Reviewed/ Care Coordinated, and agree without changes. Applicable Imaging Reviewed.  Interpretation of Laboratory Data incorporated into ED treatment  Plan- as per oncoming provider team  Mancel BaleElliott Cymone Yeske, MD 01/24/16 0003

## 2016-01-23 NOTE — ED Notes (Signed)
Pt reports shortness of breath with exertion. States she took 2 at home preg test that were both positive. Denies any hx of asthma. Pt a/o NAD at triage.

## 2016-01-24 ENCOUNTER — Emergency Department (HOSPITAL_COMMUNITY): Payer: Self-pay

## 2016-01-24 LAB — HCG, QUANTITATIVE, PREGNANCY: HCG, BETA CHAIN, QUANT, S: 52561 m[IU]/mL — AB (ref <5)

## 2016-01-24 NOTE — ED Notes (Signed)
Pt was requesting to leave and come back for discharge paperwork tomorrow; Dr Ranae PalmsYelverton printed dc instructions and discussed them with pt; pt declined to go back to room for dc vitals or signing

## 2016-01-24 NOTE — Discharge Instructions (Signed)
First Trimester of Pregnancy The first trimester of pregnancy is from week 1 until the end of week 12 (months 1 through 3). A week after a sperm fertilizes an egg, the egg will implant on the wall of the uterus. This embryo will begin to develop into a baby. Genes from you and your partner are forming the baby. The female genes determine whether the baby is a boy or a girl. At 6-8 weeks, the eyes and face are formed, and the heartbeat can be seen on ultrasound. At the end of 12 weeks, all the baby's organs are formed.  Now that you are pregnant, you will want to do everything you can to have a healthy baby. Two of the most important things are to get good prenatal care and to follow your health care provider's instructions. Prenatal care is all the medical care you receive before the baby's birth. This care will help prevent, find, and treat any problems during the pregnancy and childbirth. BODY CHANGES Your body goes through many changes during pregnancy. The changes vary from woman to woman.   You may gain or lose a couple of pounds at first.  You may feel sick to your stomach (nauseous) and throw up (vomit). If the vomiting is uncontrollable, call your health care provider.  You may tire easily.  You may develop headaches that can be relieved by medicines approved by your health care provider.  You may urinate more often. Painful urination may mean you have a bladder infection.  You may develop heartburn as a result of your pregnancy.  You may develop constipation because certain hormones are causing the muscles that push waste through your intestines to slow down.  You may develop hemorrhoids or swollen, bulging veins (varicose veins).  Your breasts may begin to grow larger and become tender. Your nipples may stick out more, and the tissue that surrounds them (areola) may become darker.  Your gums may bleed and may be sensitive to brushing and flossing.  Dark spots or blotches (chloasma,  mask of pregnancy) may develop on your face. This will likely fade after the baby is born.  Your menstrual periods will stop.  You may have a loss of appetite.  You may develop cravings for certain kinds of food.  You may have changes in your emotions from day to day, such as being excited to be pregnant or being concerned that something may go wrong with the pregnancy and baby.  You may have more vivid and strange dreams.  You may have changes in your hair. These can include thickening of your hair, rapid growth, and changes in texture. Some women also have hair loss during or after pregnancy, or hair that feels dry or thin. Your hair will most likely return to normal after your baby is born. WHAT TO EXPECT AT YOUR PRENATAL VISITS During a routine prenatal visit:  You will be weighed to make sure you and the baby are growing normally.  Your blood pressure will be taken.  Your abdomen will be measured to track your baby's growth.  The fetal heartbeat will be listened to starting around week 10 or 12 of your pregnancy.  Test results from any previous visits will be discussed. Your health care provider may ask you:  How you are feeling.  If you are feeling the baby move.  If you have had any abnormal symptoms, such as leaking fluid, bleeding, severe headaches, or abdominal cramping.  If you are using any tobacco products,   including cigarettes, chewing tobacco, and electronic cigarettes.  If you have any questions. Other tests that may be performed during your first trimester include:  Blood tests to find your blood type and to check for the presence of any previous infections. They will also be used to check for low iron levels (anemia) and Rh antibodies. Later in the pregnancy, blood tests for diabetes will be done along with other tests if problems develop.  Urine tests to check for infections, diabetes, or protein in the urine.  An ultrasound to confirm the proper growth  and development of the baby.  An amniocentesis to check for possible genetic problems.  Fetal screens for spina bifida and Down syndrome.  You may need other tests to make sure you and the baby are doing well.  HIV (human immunodeficiency virus) testing. Routine prenatal testing includes screening for HIV, unless you choose not to have this test. HOME CARE INSTRUCTIONS  Medicines  Follow your health care provider's instructions regarding medicine use. Specific medicines may be either safe or unsafe to take during pregnancy.  Take your prenatal vitamins as directed.  If you develop constipation, try taking a stool softener if your health care provider approves. Diet  Eat regular, well-balanced meals. Choose a variety of foods, such as meat or vegetable-based protein, fish, milk and low-fat dairy products, vegetables, fruits, and whole grain breads and cereals. Your health care provider will help you determine the amount of weight gain that is right for you.  Avoid raw meat and uncooked cheese. These carry germs that can cause birth defects in the baby.  Eating four or five small meals rather than three large meals a day may help relieve nausea and vomiting. If you start to feel nauseous, eating a few soda crackers can be helpful. Drinking liquids between meals instead of during meals also seems to help nausea and vomiting.  If you develop constipation, eat more high-fiber foods, such as fresh vegetables or fruit and whole grains. Drink enough fluids to keep your urine clear or pale yellow. Activity and Exercise  Exercise only as directed by your health care provider. Exercising will help you:  Control your weight.  Stay in shape.  Be prepared for labor and delivery.  Experiencing pain or cramping in the lower abdomen or low back is a good sign that you should stop exercising. Check with your health care provider before continuing normal exercises.  Try to avoid standing for long  periods of time. Move your legs often if you must stand in one place for a long time.  Avoid heavy lifting.  Wear low-heeled shoes, and practice good posture.  You may continue to have sex unless your health care provider directs you otherwise. Relief of Pain or Discomfort  Wear a good support bra for breast tenderness.   Take warm sitz baths to soothe any pain or discomfort caused by hemorrhoids. Use hemorrhoid cream if your health care provider approves.   Rest with your legs elevated if you have leg cramps or low back pain.  If you develop varicose veins in your legs, wear support hose. Elevate your feet for 15 minutes, 3-4 times a day. Limit salt in your diet. Prenatal Care  Schedule your prenatal visits by the twelfth week of pregnancy. They are usually scheduled monthly at first, then more often in the last 2 months before delivery.  Write down your questions. Take them to your prenatal visits.  Keep all your prenatal visits as directed by your   health care provider. Safety  Wear your seat belt at all times when driving.  Make a list of emergency phone numbers, including numbers for family, friends, the hospital, and police and fire departments. General Tips  Ask your health care provider for a referral to a local prenatal education class. Begin classes no later than at the beginning of month 6 of your pregnancy.  Ask for help if you have counseling or nutritional needs during pregnancy. Your health care provider can offer advice or refer you to specialists for help with various needs.  Do not use hot tubs, steam rooms, or saunas.  Do not douche or use tampons or scented sanitary pads.  Do not cross your legs for long periods of time.  Avoid cat litter boxes and soil used by cats. These carry germs that can cause birth defects in the baby and possibly loss of the fetus by miscarriage or stillbirth.  Avoid all smoking, herbs, alcohol, and medicines not prescribed by  your health care provider. Chemicals in these affect the formation and growth of the baby.  Do not use any tobacco products, including cigarettes, chewing tobacco, and electronic cigarettes. If you need help quitting, ask your health care provider. You may receive counseling support and other resources to help you quit.  Schedule a dentist appointment. At home, brush your teeth with a soft toothbrush and be gentle when you floss. SEEK MEDICAL CARE IF:   You have dizziness.  You have mild pelvic cramps, pelvic pressure, or nagging pain in the abdominal area.  You have persistent nausea, vomiting, or diarrhea.  You have a bad smelling vaginal discharge.  You have pain with urination.  You notice increased swelling in your face, hands, legs, or ankles. SEEK IMMEDIATE MEDICAL CARE IF:   You have a fever.  You are leaking fluid from your vagina.  You have spotting or bleeding from your vagina.  You have severe abdominal cramping or pain.  You have rapid weight gain or loss.  You vomit blood or material that looks like coffee grounds.  You are exposed to German measles and have never had them.  You are exposed to fifth disease or chickenpox.  You develop a severe headache.  You have shortness of breath.  You have any kind of trauma, such as from a fall or a car accident.   This information is not intended to replace advice given to you by your health care provider. Make sure you discuss any questions you have with your health care provider.   Document Released: 06/24/2001 Document Revised: 07/21/2014 Document Reviewed: 05/10/2013 [employer] Interactive Patient Education 2016 [employer]  

## 2016-02-18 ENCOUNTER — Ambulatory Visit (INDEPENDENT_AMBULATORY_CARE_PROVIDER_SITE_OTHER): Payer: PRIVATE HEALTH INSURANCE | Admitting: Obstetrics and Gynecology

## 2016-02-18 ENCOUNTER — Encounter: Payer: Self-pay | Admitting: Obstetrics and Gynecology

## 2016-02-18 ENCOUNTER — Encounter: Payer: Self-pay | Admitting: *Deleted

## 2016-02-18 DIAGNOSIS — Z3401 Encounter for supervision of normal first pregnancy, first trimester: Secondary | ICD-10-CM

## 2016-02-18 DIAGNOSIS — Z3400 Encounter for supervision of normal first pregnancy, unspecified trimester: Secondary | ICD-10-CM | POA: Insufficient documentation

## 2016-02-18 MED ORDER — ONDANSETRON 4 MG PO TBDP: 4.0000 mg | ORAL_TABLET | Freq: Four times a day (QID) | ORAL | 0 refills | Status: DC | PRN

## 2016-02-18 NOTE — Patient Instructions (Addendum)
First Trimester of Pregnancy The first trimester of pregnancy is from week 1 until the end of week 12 (months 1 through 3). A week after a sperm fertilizes an egg, the egg will implant on the wall of the uterus. This embryo will begin to develop into a baby. Genes from you and your partner are forming the baby. The female genes determine whether the baby is a boy or a girl. At 6-8 weeks, the eyes and face are formed, and the heartbeat can be seen on ultrasound. At the end of 12 weeks, all the baby's organs are formed.  Now that you are pregnant, you will want to do everything you can to have a healthy baby. Two of the most important things are to get good prenatal care and to follow your health care provider's instructions. Prenatal care is all the medical care you receive before the baby's birth. This care will help prevent, find, and treat any problems during the pregnancy and childbirth. BODY CHANGES Your body goes through many changes during pregnancy. The changes vary from woman to woman.   You may gain or lose a couple of pounds at first.  You may feel sick to your stomach (nauseous) and throw up (vomit). If the vomiting is uncontrollable, call your health care provider.  You may tire easily.  You may develop headaches that can be relieved by medicines approved by your health care provider.  You may urinate more often. Painful urination may mean you have a bladder infection.  You may develop heartburn as a result of your pregnancy.  You may develop constipation because certain hormones are causing the muscles that push waste through your intestines to slow down.  You may develop hemorrhoids or swollen, bulging veins (varicose veins).  Your breasts may begin to grow larger and become tender. Your nipples may stick out more, and the tissue that surrounds them (areola) may become darker.  Your gums may bleed and may be sensitive to brushing and flossing.  Dark spots or blotches (chloasma,  mask of pregnancy) may develop on your face. This will likely fade after the baby is born.  Your menstrual periods will stop.  You may have a loss of appetite.  You may develop cravings for certain kinds of food.  You may have changes in your emotions from day to day, such as being excited to be pregnant or being concerned that something may go wrong with the pregnancy and baby.  You may have more vivid and strange dreams.  You may have changes in your hair. These can include thickening of your hair, rapid growth, and changes in texture. Some women also have hair loss during or after pregnancy, or hair that feels dry or thin. Your hair will most likely return to normal after your baby is born. WHAT TO EXPECT AT YOUR PRENATAL VISITS During a routine prenatal visit:  You will be weighed to make sure you and the baby are growing normally.  Your blood pressure will be taken.  Your abdomen will be measured to track your baby's growth.  The fetal heartbeat will be listened to starting around week 10 or 12 of your pregnancy.  Test results from any previous visits will be discussed. Your health care provider may ask you:  How you are feeling.  If you are feeling the baby move.  If you have had any abnormal symptoms, such as leaking fluid, bleeding, severe headaches, or abdominal cramping.  If you are using any tobacco products,   including cigarettes, chewing tobacco, and electronic cigarettes.  If you have any questions. Other tests that may be performed during your first trimester include:  Blood tests to find your blood type and to check for the presence of any previous infections. They will also be used to check for low iron levels (anemia) and Rh antibodies. Later in the pregnancy, blood tests for diabetes will be done along with other tests if problems develop.  Urine tests to check for infections, diabetes, or protein in the urine.  An ultrasound to confirm the proper growth  and development of the baby.  An amniocentesis to check for possible genetic problems.  Fetal screens for spina bifida and Down syndrome.  You may need other tests to make sure you and the baby are doing well.  HIV (human immunodeficiency virus) testing. Routine prenatal testing includes screening for HIV, unless you choose not to have this test. HOME CARE INSTRUCTIONS  Medicines  Follow your health care provider's instructions regarding medicine use. Specific medicines may be either safe or unsafe to take during pregnancy.  Take your prenatal vitamins as directed.  If you develop constipation, try taking a stool softener if your health care provider approves. Diet  Eat regular, well-balanced meals. Choose a variety of foods, such as meat or vegetable-based protein, fish, milk and low-fat dairy products, vegetables, fruits, and whole grain breads and cereals. Your health care provider will help you determine the amount of weight gain that is right for you.  Avoid raw meat and uncooked cheese. These carry germs that can cause birth defects in the baby.  Eating four or five small meals rather than three large meals a day may help relieve nausea and vomiting. If you start to feel nauseous, eating a few soda crackers can be helpful. Drinking liquids between meals instead of during meals also seems to help nausea and vomiting.  If you develop constipation, eat more high-fiber foods, such as fresh vegetables or fruit and whole grains. Drink enough fluids to keep your urine clear or pale yellow. Activity and Exercise  Exercise only as directed by your health care provider. Exercising will help you:  Control your weight.  Stay in shape.  Be prepared for labor and delivery.  Experiencing pain or cramping in the lower abdomen or low back is a good sign that you should stop exercising. Check with your health care provider before continuing normal exercises.  Try to avoid standing for long  periods of time. Move your legs often if you must stand in one place for a long time.  Avoid heavy lifting.  Wear low-heeled shoes, and practice good posture.  You may continue to have sex unless your health care provider directs you otherwise. Relief of Pain or Discomfort  Wear a good support bra for breast tenderness.   Take warm sitz baths to soothe any pain or discomfort caused by hemorrhoids. Use hemorrhoid cream if your health care provider approves.   Rest with your legs elevated if you have leg cramps or low back pain.  If you develop varicose veins in your legs, wear support hose. Elevate your feet for 15 minutes, 3-4 times a day. Limit salt in your diet. Prenatal Care  Schedule your prenatal visits by the twelfth week of pregnancy. They are usually scheduled monthly at first, then more often in the last 2 months before delivery.  Write down your questions. Take them to your prenatal visits.  Keep all your prenatal visits as directed by your   health care provider. Safety  Wear your seat belt at all times when driving.  Make a list of emergency phone numbers, including numbers for family, friends, the hospital, and police and fire departments. General Tips  Ask your health care provider for a referral to a local prenatal education class. Begin classes no later than at the beginning of month 6 of your pregnancy.  Ask for help if you have counseling or nutritional needs during pregnancy. Your health care provider can offer advice or refer you to specialists for help with various needs.  Do not use hot tubs, steam rooms, or saunas.  Do not douche or use tampons or scented sanitary pads.  Do not cross your legs for long periods of time.  Avoid cat litter boxes and soil used by cats. These carry germs that can cause birth defects in the baby and possibly loss of the fetus by miscarriage or stillbirth.  Avoid all smoking, herbs, alcohol, and medicines not prescribed by  your health care provider. Chemicals in these affect the formation and growth of the baby.  Do not use any tobacco products, including cigarettes, chewing tobacco, and electronic cigarettes. If you need help quitting, ask your health care provider. You may receive counseling support and other resources to help you quit.  Schedule a dentist appointment. At home, brush your teeth with a soft toothbrush and be gentle when you floss. SEEK MEDICAL CARE IF:   You have dizziness.  You have mild pelvic cramps, pelvic pressure, or nagging pain in the abdominal area.  You have persistent nausea, vomiting, or diarrhea.  You have a bad smelling vaginal discharge.  You have pain with urination.  You notice increased swelling in your face, hands, legs, or ankles. SEEK IMMEDIATE MEDICAL CARE IF:   You have a fever.  You are leaking fluid from your vagina.  You have spotting or bleeding from your vagina.  You have severe abdominal cramping or pain.  You have rapid weight gain or loss.  You vomit blood or material that looks like coffee grounds.  You are exposed to [place] measles and have never had them.  You are exposed to fifth disease or chickenpox.  You develop a severe headache.  You have shortness of breath.  You have any kind of trauma, such as from a fall or a car accident.   This information is not intended to replace advice given to you by your health care provider. Make sure you discuss any questions you have with your health care provider.   Document Released: 06/24/2001 Document Revised: 07/21/2014 Document Reviewed: 05/10/2013 Elsevier Interactive Patient Education [employer] Breastfeeding Deciding to breastfeed is one of the best choices you can make for you and your baby. A change in hormones during pregnancy causes your breast tissue to grow and increases the number and size of your milk ducts. These hormones also allow proteins, sugars, and fats from your  blood supply to make breast milk in your milk-producing glands. Hormones prevent breast milk from being released before your baby is born as well as prompt milk flow after birth. Once breastfeeding has begun, thoughts of your baby, as well as his or her sucking or crying, can stimulate the release of milk from your milk-producing glands.  BENEFITS OF BREASTFEEDING For Your Baby  Your first milk (colostrum) helps your baby's digestive system function better.  There are antibodies in your milk that help your baby fight off infections.  Your baby has a lower incidence  of asthma, allergies, and sudden infant death syndrome.  The nutrients in breast milk are better for your baby than infant formulas and are designed uniquely for your baby's needs.  Breast milk improves your baby's brain development.  Your baby is less likely to develop other conditions, such as childhood obesity, asthma, or type 2 diabetes mellitus. For You  Breastfeeding helps to create a very special bond between you and your baby.  Breastfeeding is convenient. Breast milk is always available at the correct temperature and costs nothing.  Breastfeeding helps to burn calories and helps you lose the weight gained during pregnancy.  Breastfeeding makes your uterus contract to its prepregnancy size faster and slows bleeding (lochia) after you give birth.   Breastfeeding helps to lower your risk of developing type 2 diabetes mellitus, osteoporosis, and breast or ovarian cancer later in life. SIGNS THAT YOUR BABY IS HUNGRY Early Signs of Hunger  Increased alertness or activity.  Stretching.  Movement of the head from side to side.  Movement of the head and opening of the mouth when the corner of the mouth or cheek is stroked (rooting).  Increased sucking sounds, smacking lips, cooing, sighing, or squeaking.  Hand-to-mouth movements.  Increased sucking of fingers or hands. Late Signs of  Hunger  Fussing.  Intermittent crying. Extreme Signs of Hunger Signs of extreme hunger will require calming and consoling before your baby will be able to breastfeed successfully. Do not wait for the following signs of extreme hunger to occur before you initiate breastfeeding:  Restlessness.  A loud, strong cry.  Screaming. BREASTFEEDING BASICS Breastfeeding Initiation  Find a comfortable place to sit or lie down, with your neck and back well supported.  Place a pillow or rolled up blanket under your baby to bring him or her to the level of your breast (if you are seated). Nursing pillows are specially designed to help support your arms and your baby while you breastfeed.  Make sure that your baby's abdomen is facing your abdomen.  Gently massage your breast. With your fingertips, massage from your chest wall toward your nipple in a circular motion. This encourages milk flow. You may need to continue this action during the feeding if your milk flows slowly.  Support your breast with 4 fingers underneath and your thumb above your nipple. Make sure your fingers are well away from your nipple and your baby's mouth.  Stroke your baby's lips gently with your finger or nipple.  When your baby's mouth is open wide enough, quickly bring your baby to your breast, placing your entire nipple and as much of the colored area around your nipple (areola) as possible into your baby's mouth.  More areola should be visible above your baby's upper lip than below the lower lip.  Your baby's tongue should be between his or her lower gum and your breast.  Ensure that your baby's mouth is correctly positioned around your nipple (latched). Your baby's lips should create a seal on your breast and be turned out (everted).  It is common for your baby to suck about 2-3 minutes in order to start the flow of breast milk. Latching Teaching your baby how to latch on to your breast properly is very important.  An improper latch can cause nipple pain and decreased milk supply for you and poor weight gain in your baby. Also, if your baby is not latched onto your nipple properly, he or she may swallow some air during feeding. This can make your  baby fussy. Burping your baby when you switch breasts during the feeding can help to get rid of the air. However, teaching your baby to latch on properly is still the best way to prevent fussiness from swallowing air while breastfeeding. Signs that your baby has successfully latched on to your nipple:  Silent tugging or silent sucking, without causing you pain.  Swallowing heard between every 3-4 sucks.  Muscle movement above and in front of his or her ears while sucking. Signs that your baby has not successfully latched on to nipple:  Sucking sounds or smacking sounds from your baby while breastfeeding.  Nipple pain. If you think your baby has not latched on correctly, slip your finger into the corner of your baby's mouth to break the suction and place it between your baby's gums. Attempt breastfeeding initiation again. Signs of Successful Breastfeeding Signs from your baby:  A gradual decrease in the number of sucks or complete cessation of sucking.  Falling asleep.  Relaxation of his or her body.  Retention of a small amount of milk in his or her mouth.  Letting go of your breast by himself or herself. Signs from you:  Breasts that have increased in firmness, weight, and size 1-3 hours after feeding.  Breasts that are softer immediately after breastfeeding.  Increased milk volume, as well as a change in milk consistency and color by the fifth day of breastfeeding.  Nipples that are not sore, cracked, or bleeding. Signs That Your Pecola Leisure is Getting Enough Milk  Wetting at least 3 diapers in a 24-hour period. The urine should be clear and pale yellow by age 15 days.  At least 3 stools in a 24-hour period by age 15 days. The stool should be soft and  yellow.  At least 3 stools in a 24-hour period by age 714 days. The stool should be seedy and yellow.  No loss of weight greater than 10% of birth weight during the first 29 days of age.  Average weight gain of 4-7 ounces (113-198 g) per week after age 71 days.  Consistent daily weight gain by age 15 days, without weight loss after the age of 2 weeks. After a feeding, your baby may spit up a small amount. This is common. BREASTFEEDING FREQUENCY AND DURATION Frequent feeding will help you make more milk and can prevent sore nipples and breast engorgement. Breastfeed when you feel the need to reduce the fullness of your breasts or when your baby shows signs of hunger. This is called "breastfeeding on demand." Avoid introducing a pacifier to your baby while you are working to establish breastfeeding (the first 4-6 weeks after your baby is born). After this time you may choose to use a pacifier. Research has shown that pacifier use during the first year of a baby's life decreases the risk of sudden infant death syndrome (SIDS). Allow your baby to feed on each breast as long as he or she wants. Breastfeed until your baby is finished feeding. When your baby unlatches or falls asleep while feeding from the first breast, offer the second breast. Because newborns are often sleepy in the first few weeks of life, you may need to awaken your baby to get him or her to feed. Breastfeeding times will vary from baby to baby. However, the following rules can serve as a guide to help you ensure that your baby is properly fed:  Newborns (babies 31 weeks of age or younger) may breastfeed every 1-3 hours.  Newborns should  not go longer than 3 hours during the day or 5 hours during the night without breastfeeding.  You should breastfeed your baby a minimum of 8 times in a 24-hour period until you begin to introduce solid foods to your baby at around 12 months of age. BREAST MILK PUMPING Pumping and storing breast milk  allows you to ensure that your baby is exclusively fed your breast milk, even at times when you are unable to breastfeed. This is especially important if you are going back to work while you are still breastfeeding or when you are not able to be present during feedings. Your lactation consultant can give you guidelines on how long it is safe to store breast milk. A breast pump is a machine that allows you to pump milk from your breast into a sterile bottle. The pumped breast milk can then be stored in a refrigerator or freezer. Some breast pumps are operated by hand, while others use electricity. Ask your lactation consultant which type will work best for you. Breast pumps can be purchased, but some hospitals and breastfeeding support groups lease breast pumps on a monthly basis. A lactation consultant can teach you how to hand express breast milk, if you prefer not to use a pump. CARING FOR YOUR BREASTS WHILE YOU BREASTFEED Nipples can become dry, cracked, and sore while breastfeeding. The following recommendations can help keep your breasts moisturized and healthy:  Avoid using soap on your nipples.  Wear a supportive bra. Although not required, special nursing bras and tank tops are designed to allow access to your breasts for breastfeeding without taking off your entire bra or top. Avoid wearing underwire-style bras or extremely tight bras.  Air dry your nipples for 3-48minutes after each feeding.  Use only cotton bra pads to absorb leaked breast milk. Leaking of breast milk between feedings is normal.  Use lanolin on your nipples after breastfeeding. Lanolin helps to maintain your skin's normal moisture barrier. If you use pure lanolin, you do not need to wash it off before feeding your baby again. Pure lanolin is not toxic to your baby. You may also hand express a few drops of breast milk and gently massage that milk into your nipples and allow the milk to air dry. In the first few weeks after  giving birth, some women experience extremely full breasts (engorgement). Engorgement can make your breasts feel heavy, warm, and tender to the touch. Engorgement peaks within 3-5 days after you give birth. The following recommendations can help ease engorgement:  Completely empty your breasts while breastfeeding or pumping. You may want to start by applying warm, moist heat (in the shower or with warm water-soaked hand towels) just before feeding or pumping. This increases circulation and helps the milk flow. If your baby does not completely empty your breasts while breastfeeding, pump any extra milk after he or she is finished.  Wear a snug bra (nursing or regular) or tank top for 1-2 days to signal your body to slightly decrease milk production.  Apply ice packs to your breasts, unless this is too uncomfortable for you.  Make sure that your baby is latched on and positioned properly while breastfeeding. If engorgement persists after 48 hours of following these recommendations, contact your health care provider or a Advertising copywriter. OVERALL HEALTH CARE RECOMMENDATIONS WHILE BREASTFEEDING  Eat healthy foods. Alternate between meals and snacks, eating 3 of each per day. Because what you eat affects your breast milk, some of the foods may make  your baby more irritable than usual. Avoid eating these foods if you are sure that they are negatively affecting your baby.  Drink milk, fruit juice, and water to satisfy your thirst (about 10 glasses a day).  Rest often, relax, and continue to take your prenatal vitamins to prevent fatigue, stress, and anemia.  Continue breast self-awareness checks.  Avoid chewing and smoking tobacco. Chemicals from cigarettes that pass into breast milk and exposure to secondhand smoke may harm your baby.  Avoid alcohol and drug use, including marijuana. Some medicines that may be harmful to your baby can pass through breast milk. It is important to ask your health  care provider before taking any medicine, including all over-the-counter and prescription medicine as well as vitamin and herbal supplements. It is possible to become pregnant while breastfeeding. If birth control is desired, ask your health care provider about options that will be safe for your baby. SEEK MEDICAL CARE IF:  You feel like you want to stop breastfeeding or have become frustrated with breastfeeding.  You have painful breasts or nipples.  Your nipples are cracked or bleeding.  Your breasts are red, tender, or warm.  You have a swollen area on either breast.  You have a fever or chills.  You have nausea or vomiting.  You have drainage other than breast milk from your nipples.  Your breasts do not become full before feedings by the fifth day after you give birth.  You feel sad and depressed.  Your baby is too sleepy to eat well.  Your baby is having trouble sleeping.   Your baby is wetting less than 3 diapers in a 24-hour period.  Your baby has less than 3 stools in a 24-hour period.  Your baby's skin or the white part of his or her eyes becomes yellow.   Your baby is not gaining weight by 81 days of age. SEEK IMMEDIATE MEDICAL CARE IF:  Your baby is overly tired (lethargic) and does not want to wake up and feed.  Your baby develops an unexplained fever.   This information is not intended to replace advice given to you by your health care provider. Make sure you discuss any questions you have with your health care provider.   Document Released: 06/30/2005 Document Revised: 03/21/2015 Document Reviewed: 12/22/2012 Else[employer] Interactive Patient Education [employer]     First Trimester of Pregnancy The first trimester of pregnancy is from week 1 until the end of week 12 (months 1 through 3). A week after a sperm fertilizes an egg, the egg will implant on the wall of the uterus. This embryo will begin to develop into a baby. Genes from you and your  partner are forming the baby. The female genes determine whether the baby is a boy or a girl. At 6-8 weeks, the eyes and face are formed, and the heartbeat can be seen on ultrasound. At the end of 12 weeks, all the baby's organs are formed.  Now that you are pregnant, you will want to do everything you can to have a healthy baby. Two of the most important things are to get good prenatal care and to follow your health care provider's instructions. Prenatal care is all the medical care you receive before the baby's birth. This care will help prevent, find, and treat any problems during the pregnancy and childbirth. BODY CHANGES Your body goes through many changes during pregnancy. The changes vary from woman to woman.   You may gain or lose  a couple of pounds at first.  You may feel sick to your stomach (nauseous) and throw up (vomit). If the vomiting is uncontrollable, call your health care provider.  You may tire easily.  You may develop headaches that can be relieved by medicines approved by your health care provider.  You may urinate more often. Painful urination may mean you have a bladder infection.  You may develop heartburn as a result of your pregnancy.  You may develop constipation because certain hormones are causing the muscles that push waste through your intestines to slow down.  You may develop hemorrhoids or swollen, bulging veins (varicose veins).  Your breasts may begin to grow larger and become tender. Your nipples may stick out more, and the tissue that surrounds them (areola) may become darker.  Your gums may bleed and may be sensitive to brushing and flossing.  Dark spots or blotches (chloasma, mask of pregnancy) may develop on your face. This will likely fade after the baby is born.  Your menstrual periods will stop.  You may have a loss of appetite.  You may develop cravings for certain kinds of food.  You may have changes in your emotions from day to day, such as  being excited to be pregnant or being concerned that something may go wrong with the pregnancy and baby.  You may have more vivid and strange dreams.  You may have changes in your hair. These can include thickening of your hair, rapid growth, and changes in texture. Some women also have hair loss during or after pregnancy, or hair that feels dry or thin. Your hair will most likely return to normal after your baby is born. WHAT TO EXPECT AT YOUR PRENATAL VISITS During a routine prenatal visit:  You will be weighed to make sure you and the baby are growing normally.  Your blood pressure will be taken.  Your abdomen will be measured to track your baby's growth.  The fetal heartbeat will be listened to starting around week 10 or 12 of your pregnancy.  Test results from any previous visits will be discussed. Your health care provider may ask you:  How you are feeling.  If you are feeling the baby move.  If you have had any abnormal symptoms, such as leaking fluid, bleeding, severe headaches, or abdominal cramping.  If you are using any tobacco products, including cigarettes, chewing tobacco, and electronic cigarettes.  If you have any questions. Other tests that may be performed during your first trimester include:  Blood tests to find your blood type and to check for the presence of any previous infections. They will also be used to check for low iron levels (anemia) and Rh antibodies. Later in the pregnancy, blood tests for diabetes will be done along with other tests if problems develop.  Urine tests to check for infections, diabetes, or protein in the urine.  An ultrasound to confirm the proper growth and development of the baby.  An amniocentesis to check for possible genetic problems.  Fetal screens for spina bifida and Down syndrome.  You may need other tests to make sure you and the baby are doing well.  HIV (human immunodeficiency virus) testing. Routine prenatal testing  includes screening for HIV, unless you choose not to have this test. HOME CARE INSTRUCTIONS  Medicines  Follow your health care provider's instructions regarding medicine use. Specific medicines may be either safe or unsafe to take during pregnancy.  Take your prenatal vitamins as directed.  If  you develop constipation, try taking a stool softener if your health care provider approves. Diet  Eat regular, well-balanced meals. Choose a variety of foods, such as meat or vegetable-based protein, fish, milk and low-fat dairy products, vegetables, fruits, and whole grain breads and cereals. Your health care provider will help you determine the amount of weight gain that is right for you.  Avoid raw meat and uncooked cheese. These carry germs that can cause birth defects in the baby.  Eating four or five small meals rather than three large meals a day may help relieve nausea and vomiting. If you start to feel nauseous, eating a few soda crackers can be helpful. Drinking liquids between meals instead of during meals also seems to help nausea and vomiting.  If you develop constipation, eat more high-fiber foods, such as fresh vegetables or fruit and whole grains. Drink enough fluids to keep your urine clear or pale yellow. Activity and Exercise  Exercise only as directed by your health care provider. Exercising will help you:  Control your weight.  Stay in shape.  Be prepared for labor and delivery.  Experiencing pain or cramping in the lower abdomen or low back is a good sign that you should stop exercising. Check with your health care provider before continuing normal exercises.  Try to avoid standing for long periods of time. Move your legs often if you must stand in one place for a long time.  Avoid heavy lifting.  Wear low-heeled shoes, and practice good posture.  You may continue to have sex unless your health care provider directs you otherwise. Relief of Pain or Discomfort  Wear  a good support bra for breast tenderness.   Take warm sitz baths to soothe any pain or discomfort caused by hemorrhoids. Use hemorrhoid cream if your health care provider approves.   Rest with your legs elevated if you have leg cramps or low back pain.  If you develop varicose veins in your legs, wear support hose. Elevate your feet for 15 minutes, 3-4 times a day. Limit salt in your diet. Prenatal Care  Schedule your prenatal visits by the twelfth week of pregnancy. They are usually scheduled monthly at first, then more often in the last 2 months before delivery.  Write down your questions. Take them to your prenatal visits.  Keep all your prenatal visits as directed by your health care provider. Safety  Wear your seat belt at all times when driving.  Make a list of emergency phone numbers, including numbers for family, friends, the hospital, and police and fire departments. General Tips  Ask your health care provider for a referral to a local prenatal education class. Begin classes no later than at the beginning of month 6 of your pregnancy.  Ask for help if you have counseling or nutritional needs during pregnancy. Your health care provider can offer advice or refer you to specialists for help with various needs.  Do not use hot tubs, steam rooms, or saunas.  Do not douche or use tampons or scented sanitary pads.  Do not cross your legs for long periods of time.  Avoid cat litter boxes and soil used by cats. These carry germs that can cause birth defects in the baby and possibly loss of the fetus by miscarriage or stillbirth.  Avoid all smoking, herbs, alcohol, and medicines not prescribed by your health care provider. Chemicals in these affect the formation and growth of the baby.  Do not use any tobacco products, including cigarettes,  chewing tobacco, and electronic cigarettes. If you need help quitting, ask your health care provider. You may receive counseling support and  other resources to help you quit.  Schedule a dentist appointment. At home, brush your teeth with a soft toothbrush and be gentle when you floss. SEEK MEDICAL CARE IF:   You have dizziness.  You have mild pelvic cramps, pelvic pressure, or nagging pain in the abdominal area.  You have persistent nausea, vomiting, or diarrhea.  You have a bad smelling vaginal discharge.  You have pain with urination.  You notice increased swelling in your face, hands, legs, or ankles. SEEK IMMEDIATE MEDICAL CARE IF:   You have a fever.  You are leaking fluid from your vagina.  You have spotting or bleeding from your vagina.  You have severe abdominal cramping or pain.  You have rapid weight gain or loss.  You vomit blood or material that looks like coffee grounds.  You are exposed to [place] measles and have never had them.  You are exposed to fifth disease or chickenpox.  You develop a severe headache.  You have shortness of breath.  You have any kind of trauma, such as from a fall or a car accident.   This information is not intended to replace advice given to you by your health care provider. Make sure you discuss any questions you have with your health care provider.   Document Released: 06/24/2001 Document Revised: 07/21/2014 Document Reviewed: 05/10/2013 Elsevier Interactive Patient Education [employer]  Contraception Choices Contraception (birth control) is the use of any methods or devices to prevent pregnancy. Below are some methods to help avoid pregnancy. HORMONAL METHODS   Contraceptive implant. This is a thin, plastic tube containing progesterone hormone. It does not contain estrogen hormone. Your health care provider inserts the tube in the inner part of the upper arm. The tube can remain in place for up to 3 years. After 3 years, the implant must be removed. The implant prevents the ovaries from releasing an egg (ovulation), thickens the cervical mucus to prevent  sperm from entering the uterus, and thins the lining of the inside of the uterus.  Progesterone-only injections. These injections are given every 3 months by your health care provider to prevent pregnancy. This synthetic progesterone hormone stops the ovaries from releasing eggs. It also thickens cervical mucus and changes the uterine lining. This makes it harder for sperm to survive in the uterus.  Birth control pills. These pills contain estrogen and progesterone hormone. They work by preventing the ovaries from releasing eggs (ovulation). They also cause the cervical mucus to thicken, preventing the sperm from entering the uterus. Birth control pills are prescribed by a health care provider.Birth control pills can also be used to treat heavy periods.  Minipill. This type of birth control pill contains only the progesterone hormone. They are taken every day of each month and must be prescribed by your health care provider.  Birth control patch. The patch contains hormones similar to those in birth control pills. It must be changed once a week and is prescribed by a health care provider.  Vaginal ring. The ring contains hormones similar to those in birth control pills. It is left in the vagina for 3 weeks, removed for 1 week, and then a new one is put back in place. The patient must be comfortable inserting and removing the ring from the vagina.A health care provider's prescription is necessary.  Emergency contraception. Emergency contraceptives prevent pregnancy after  unprotected sexual intercourse. This pill can be taken right after sex or up to 5 days after unprotected sex. It is most effective the sooner you take the pills after having sexual intercourse. Most emergency contraceptive pills are available without a prescription. Check with your pharmacist. Do not use emergency contraception as your only form of birth control. BARRIER METHODS   Female condom. This is a thin sheath (latex or rubber)  that is worn over the penis during sexual intercourse. It can be used with spermicide to increase effectiveness.  Female condom. This is a soft, loose-fitting sheath that is put into the vagina before sexual intercourse.  Diaphragm. This is a soft, latex, dome-shaped barrier that must be fitted by a health care provider. It is inserted into the vagina, along with a spermicidal jelly. It is inserted before intercourse. The diaphragm should be left in the vagina for 6 to 8 hours after intercourse.  Cervical cap. This is a round, soft, latex or plastic cup that fits over the cervix and must be fitted by a health care provider. The cap can be left in place for up to 48 hours after intercourse.  Sponge. This is a soft, circular piece of polyurethane foam. The sponge has spermicide in it. It is inserted into the vagina after wetting it and before sexual intercourse.  Spermicides. These are chemicals that kill or block sperm from entering the cervix and uterus. They come in the form of creams, jellies, suppositories, foam, or tablets. They do not require a prescription. They are inserted into the vagina with an applicator before having sexual intercourse. The process must be repeated every time you have sexual intercourse. INTRAUTERINE CONTRACEPTION  Intrauterine device (IUD). This is a T-shaped device that is put in a woman's uterus during a menstrual period to prevent pregnancy. There are 2 types:  Copper IUD. This type of IUD is wrapped in copper wire and is placed inside the uterus. Copper makes the uterus and fallopian tubes produce a fluid that kills sperm. It can stay in place for 10 years.  Hormone IUD. This type of IUD contains the hormone progestin (synthetic progesterone). The hormone thickens the cervical mucus and prevents sperm from entering the uterus, and it also thins the uterine lining to prevent implantation of a fertilized egg. The hormone can weaken or kill the sperm that get into the  uterus. It can stay in place for 3-5 years, depending on which type of IUD is used. PERMANENT METHODS OF CONTRACEPTION  Female tubal ligation. This is when the woman's fallopian tubes are surgically sealed, tied, or blocked to prevent the egg from traveling to the uterus.  Hysteroscopic sterilization. This involves placing a small coil or insert into each fallopian tube. Your doctor uses a technique called hysteroscopy to do the procedure. The device causes scar tissue to form. This results in permanent blockage of the fallopian tubes, so the sperm cannot fertilize the egg. It takes about 3 months after the procedure for the tubes to become blocked. You must use another form of birth control for these 3 months.  Female sterilization. This is when the female has the tubes that carry sperm tied off (vasectomy).This blocks sperm from entering the vagina during sexual intercourse. After the procedure, the man can still ejaculate fluid (semen). NATURAL PLANNING METHODS  Natural family planning. This is not having sexual intercourse or using a barrier method (condom, diaphragm, cervical cap) on days the woman could become pregnant.  Calendar method. This is keeping  track of the length of each menstrual cycle and identifying when you are fertile.  Ovulation method. This is avoiding sexual intercourse during ovulation.  Symptothermal method. This is avoiding sexual intercourse during ovulation, using a thermometer and ovulation symptoms.  Post-ovulation method. This is timing sexual intercourse after you have ovulated. Regardless of which type or method of contraception you choose, it is important that you use condoms to protect against the transmission of sexually transmitted infections (STIs). Talk with your health care provider about which form of contraception is most appropriate for you.   This information is not intended to replace advice given to you by your health care provider. Make sure you  discuss any questions you have with your health care provider.   Document Released: 06/30/2005 Document Revised: 07/05/2013 Document Reviewed: 12/23/2012 Elsevier Interactive Patient Education [employer]   Breastfeeding Deciding to breastfeed is one of the best choices you can make for you and your baby. A change in hormones during pregnancy causes your breast tissue to grow and increases the number and size of your milk ducts. These hormones also allow proteins, sugars, and fats from your blood supply to make breast milk in your milk-producing glands. Hormones prevent breast milk from being released before your baby is born as well as prompt milk flow after birth. Once breastfeeding has begun, thoughts of your baby, as well as his or her sucking or crying, can stimulate the release of milk from your milk-producing glands.  BENEFITS OF BREASTFEEDING For Your Baby  Your first milk (colostrum) helps your baby's digestive system function better.  There are antibodies in your milk that help your baby fight off infections.  Your baby has a lower incidence of asthma, allergies, and sudden infant death syndrome.  The nutrients in breast milk are better for your baby than infant formulas and are designed uniquely for your baby's needs.  Breast milk improves your baby's brain development.  Your baby is less likely to develop other conditions, such as childhood obesity, asthma, or type 2 diabetes mellitus. For You  Breastfeeding helps to create a very special bond between you and your baby.  Breastfeeding is convenient. Breast milk is always available at the correct temperature and costs nothing.  Breastfeeding helps to burn calories and helps you lose the weight gained during pregnancy.  Breastfeeding makes your uterus contract to its prepregnancy size faster and slows bleeding (lochia) after you give birth.   Breastfeeding helps to lower your risk of developing type 2 diabetes  mellitus, osteoporosis, and breast or ovarian cancer later in life. SIGNS THAT YOUR BABY IS HUNGRY Early Signs of Hunger  Increased alertness or activity.  Stretching.  Movement of the head from side to side.  Movement of the head and opening of the mouth when the corner of the mouth or cheek is stroked (rooting).  Increased sucking sounds, smacking lips, cooing, sighing, or squeaking.  Hand-to-mouth movements.  Increased sucking of fingers or hands. Late Signs of Hunger  Fussing.  Intermittent crying. Extreme Signs of Hunger Signs of extreme hunger will require calming and consoling before your baby will be able to breastfeed successfully. Do not wait for the following signs of extreme hunger to occur before you initiate breastfeeding:  Restlessness.  A loud, strong cry.  Screaming. BREASTFEEDING BASICS Breastfeeding Initiation  Find a comfortable place to sit or lie down, with your neck and back well supported.  Place a pillow or rolled up blanket under your baby to bring him  or her to the level of your breast (if you are seated). Nursing pillows are specially designed to help support your arms and your baby while you breastfeed.  Make sure that your baby's abdomen is facing your abdomen.  Gently massage your breast. With your fingertips, massage from your chest wall toward your nipple in a circular motion. This encourages milk flow. You may need to continue this action during the feeding if your milk flows slowly.  Support your breast with 4 fingers underneath and your thumb above your nipple. Make sure your fingers are well away from your nipple and your baby's mouth.  Stroke your baby's lips gently with your finger or nipple.  When your baby's mouth is open wide enough, quickly bring your baby to your breast, placing your entire nipple and as much of the colored area around your nipple (areola) as possible into your baby's mouth.  More areola should be visible  above your baby's upper lip than below the lower lip.  Your baby's tongue should be between his or her lower gum and your breast.  Ensure that your baby's mouth is correctly positioned around your nipple (latched). Your baby's lips should create a seal on your breast and be turned out (everted).  It is common for your baby to suck about 2-3 minutes in order to start the flow of breast milk. Latching Teaching your baby how to latch on to your breast properly is very important. An improper latch can cause nipple pain and decreased milk supply for you and poor weight gain in your baby. Also, if your baby is not latched onto your nipple properly, he or she may swallow some air during feeding. This can make your baby fussy. Burping your baby when you switch breasts during the feeding can help to get rid of the air. However, teaching your baby to latch on properly is still the best way to prevent fussiness from swallowing air while breastfeeding. Signs that your baby has successfully latched on to your nipple:  Silent tugging or silent sucking, without causing you pain.  Swallowing heard between every 3-4 sucks.  Muscle movement above and in front of his or her ears while sucking. Signs that your baby has not successfully latched on to nipple:  Sucking sounds or smacking sounds from your baby while breastfeeding.  Nipple pain. If you think your baby has not latched on correctly, slip your finger into the corner of your baby's mouth to break the suction and place it between your baby's gums. Attempt breastfeeding initiation again. Signs of Successful Breastfeeding Signs from your baby:  A gradual decrease in the number of sucks or complete cessation of sucking.  Falling asleep.  Relaxation of his or her body.  Retention of a small amount of milk in his or her mouth.  Letting go of your breast by himself or herself. Signs from you:  Breasts that have increased in firmness, weight, and  size 1-3 hours after feeding.  Breasts that are softer immediately after breastfeeding.  Increased milk volume, as well as a change in milk consistency and color by the fifth day of breastfeeding.  Nipples that are not sore, cracked, or bleeding. Signs That Your Pec[place] Leisure is Getting Enough Milk  Wetting at least 3 diapers in a 24-hour period. The urine should be clear and pale yellow by age 100 days.  At least 3 stools in a 24-hour period by age 100 days. The stool should be soft and yellow.  At least  3 stools in a 24-hour period by age 3 days. The stool should be seedy and yellow.  No loss of weight greater than 10% of birth weight during the first 81 days of age.  Average weight gain of 4-7 ounces (113-198 g) per week after age 1 days.  Consistent daily weight gain by age 63 days, without weight loss after the age of 2 weeks. After a feeding, your baby may spit up a small amount. This is common. BREASTFEEDING FREQUENCY AND DURATION Frequent feeding will help you make more milk and can prevent sore nipples and breast engorgement. Breastfeed when you feel the need to reduce the fullness of your breasts or when your baby shows signs of hunger. This is called "breastfeeding on demand." Avoid introducing a pacifier to your baby while you are working to establish breastfeeding (the first 4-6 weeks after your baby is born). After this time you may choose to use a pacifier. Research has shown that pacifier use during the first year of a baby's life decreases the risk of sudden infant death syndrome (SIDS). Allow your baby to feed on each breast as long as he or she wants. Breastfeed until your baby is finished feeding. When your baby unlatches or falls asleep while feeding from the first breast, offer the second breast. Because newborns are often sleepy in the first few weeks of life, you may need to awaken your baby to get him or her to feed. Breastfeeding times will vary from baby to baby. However, the  following rules can serve as a guide to help you ensure that your baby is properly fed:  Newborns (babies 60 weeks of age or younger) may breastfeed every 1-3 hours.  Newborns should not go longer than 3 hours during the day or 5 hours during the night without breastfeeding.  You should breastfeed your baby a minimum of 8 times in a 24-hour period until you begin to introduce solid foods to your baby at around 45 months of age. BREAST MILK PUMPING Pumping and storing breast milk allows you to ensure that your baby is exclusively fed your breast milk, even at times when you are unable to breastfeed. This is especially important if you are going back to work while you are still breastfeeding or when you are not able to be present during feedings. Your lactation consultant can give you guidelines on how long it is safe to store breast milk. A breast pump is a machine that allows you to pump milk from your breast into a sterile bottle. The pumped breast milk can then be stored in a refrigerator or freezer. Some breast pumps are operated by hand, while others use electricity. Ask your lactation consultant which type will work best for you. Breast pumps can be purchased, but some hospitals and breastfeeding support groups lease breast pumps on a monthly basis. A lactation consultant can teach you how to hand express breast milk, if you prefer not to use a pump. CARING FOR YOUR BREASTS WHILE YOU BREASTFEED Nipples can become dry, cracked, and sore while breastfeeding. The following recommendations can help keep your breasts moisturized and healthy:  Avoid using soap on your nipples.  Wear a supportive bra. Although not required, special nursing bras and tank tops are designed to allow access to your breasts for breastfeeding without taking off your entire bra or top. Avoid wearing underwire-style bras or extremely tight bras.  Air dry your nipples for 3-68minutes after each feeding.  Use only cotton bra  pads to absorb leaked breast milk. Leaking of breast milk between feedings is normal.  Use lanolin on your nipples after breastfeeding. Lanolin helps to maintain your skin's normal moisture barrier. If you use pure lanolin, you do not need to wash it off before feeding your baby again. Pure lanolin is not toxic to your baby. You may also hand express a few drops of breast milk and gently massage that milk into your nipples and allow the milk to air dry. In the first few weeks after giving birth, some women experience extremely full breasts (engorgement). Engorgement can make your breasts feel heavy, warm, and tender to the touch. Engorgement peaks within 3-5 days after you give birth. The following recommendations can help ease engorgement:  Completely empty your breasts while breastfeeding or pumping. You may want to start by applying warm, moist heat (in the shower or with warm water-soaked hand towels) just before feeding or pumping. This increases circulation and helps the milk flow. If your baby does not completely empty your breasts while breastfeeding, pump any extra milk after he or she is finished.  Wear a snug bra (nursing or regular) or tank top for 1-2 days to signal your body to slightly decrease milk production.  Apply ice packs to your breasts, unless this is too uncomfortable for you.  Make sure that your baby is latched on and positioned properly while breastfeeding. If engorgement persists after 48 hours of following these recommendations, contact your health care provider or a Advertising copywriter. OVERALL HEALTH CARE RECOMMENDATIONS WHILE BREASTFEEDING  Eat healthy foods. Alternate between meals and snacks, eating 3 of each per day. Because what you eat affects your breast milk, some of the foods may make your baby more irritable than usual. Avoid eating these foods if you are sure that they are negatively affecting your baby.  Drink milk, fruit juice, and water to satisfy your  thirst (about 10 glasses a day).  Rest often, relax, and continue to take your prenatal vitamins to prevent fatigue, stress, and anemia.  Continue breast self-awareness checks.  Avoid chewing and smoking tobacco. Chemicals from cigarettes that pass into breast milk and exposure to secondhand smoke may harm your baby.  Avoid alcohol and drug use, including marijuana. Some medicines that may be harmful to your baby can pass through breast milk. It is important to ask your health care provider before taking any medicine, including all over-the-counter and prescription medicine as well as vitamin and herbal supplements. It is possible to become pregnant while breastfeeding. If birth control is desired, ask your health care provider about options that will be safe for your baby. SEEK MEDICAL CARE IF:  You feel like you want to stop breastfeeding or have become frustrated with breastfeeding.  You have painful breasts or nipples.  Your nipples are cracked or bleeding.  Your breasts are red, tender, or warm.  You have a swollen area on either breast.  You have a fever or chills.  You have nausea or vomiting.  You have drainage other than breast milk from your nipples.  Your breasts do not become full before feedings by the fifth day after you give birth.  You feel sad and depressed.  Your baby is too sleepy to eat well.  Your baby is having trouble sleeping.   Your baby is wetting less than 3 diapers in a 24-hour period.  Your baby has less than 3 stools in a 24-hour period.  Your baby's skin or the white part of  his or her eyes becomes yellow.   Your baby is not gaining weight by 68 days of age. SEEK IMMEDIATE MEDICAL CARE IF:  Your baby is overly tired (lethargic) and does not want to wake up and feed.  Your baby develops an unexplained fever.   This information is not intended to replace advice given to you by your health care provider. Make sure you discuss any  questions you have with your health care provider.   Document Released: 06/30/2005 Document Revised: 03/21/2015 Document Reviewed: 12/22/2012 Elsevier Interactive Patient Education [employer]

## 2016-02-18 NOTE — Progress Notes (Signed)
  Subjective:    Tammie Mckee is a G2P0010 8481w1d being seen today for her first obstetrical visit.  Her obstetrical history is significant for first pregnancy. Patient does intend to breast feed. Pregnancy history fully reviewed.  Patient reports nausea.  Vitals:   02/18/16 1013  BP: 109/72  Pulse: 80  Weight: 202 lb (91.6 kg)    HISTORY: OB History  Gravida Para Term Preterm AB Living  2 0 0 0 1 0  SAB TAB Ectopic Multiple Live Births  1 0 0 0 0    # Outcome Date GA Lbr Len/2nd Weight Sex Delivery Anes PTL Lv  2 Current           1 SAB              Birth Comments: System Generated. Please review and update pregnancy details.     History reviewed. No pertinent past medical history. Past Surgical History:  Procedure Laterality Date  . KNEE SURGERY     Family History  Problem Relation Age of Onset  . Diabetes Father   . Hypertension Father      Exam    Uterus:     Pelvic Exam:    Perineum: No Hemorrhoids, Normal Perineum   Vulva: normal   Vagina:  normal mucosa, normal discharge   pH:    Cervix: nulliparous appearance and cervix is closed and long   Adnexa: normal adnexa and no mass, fullness, tenderness   Bony Pelvis: gynecoid  System: Breast:  normal appearance, no masses or tenderness   Skin: normal coloration and turgor, no rashes    Neurologic: oriented, no focal deficits   Extremities: normal strength, tone, and muscle mass   HEENT extra ocular movement intact   Mouth/Teeth mucous membranes moist, pharynx normal without lesions and dental hygiene good   Neck supple and no masses   Cardiovascular: regular rate and rhythm   Respiratory:  chest clear, no wheezing, crepitations, rhonchi, normal symmetric air entry   Abdomen: soft, non-tender; bowel sounds normal; no masses,  no organomegaly   Urinary:       Assessment:    Pregnancy: G2P0010 Patient Active Problem List   Diagnosis Date Noted  . Supervision of normal first pregnancy, antepartum  02/18/2016        Plan:     Initial labs drawn. Prenatal vitamins. Problem list reviewed and updated. Genetic Screening discussed : undecided.  Ultrasound discussed; fetal survey: requested. Needs to be ordered at next visit Rx Zofran provided  Follow up in 4 weeks. 50% of 30 min visit spent on counseling and coordination of care.     Tammie Mckee 02/18/2016

## 2016-02-19 ENCOUNTER — Encounter: Payer: Self-pay | Admitting: Obstetrics and Gynecology

## 2016-02-19 DIAGNOSIS — D573 Sickle-cell trait: Secondary | ICD-10-CM | POA: Insufficient documentation

## 2016-02-19 LAB — GC/CHLAMYDIA PROBE AMP
CHLAMYDIA, DNA PROBE: NEGATIVE
NEISSERIA GONORRHOEAE BY PCR: NEGATIVE

## 2016-02-20 LAB — URINE CULTURE, OB REFLEX

## 2016-02-20 LAB — CULTURE, OB URINE

## 2016-02-22 LAB — PAP IG W/ RFLX HPV ASCU: PAP Smear Comment: 0

## 2016-02-26 ENCOUNTER — Encounter: Payer: Self-pay | Admitting: Obstetrics and Gynecology

## 2016-02-26 DIAGNOSIS — Z283 Underimmunization status: Secondary | ICD-10-CM

## 2016-02-26 DIAGNOSIS — O09899 Supervision of other high risk pregnancies, unspecified trimester: Secondary | ICD-10-CM | POA: Insufficient documentation

## 2016-02-26 LAB — PRENATAL PROFILE I(LABCORP)
Antibody Screen: NEGATIVE
BASOS ABS: 0 10*3/uL (ref 0.0–0.2)
BASOS: 0 %
EOS (ABSOLUTE): 0.1 10*3/uL (ref 0.0–0.4)
Eos: 1 %
HEMOGLOBIN: 12.2 g/dL (ref 11.1–15.9)
Hematocrit: 35.5 % (ref 34.0–46.6)
Hepatitis B Surface Ag: NEGATIVE
Immature Grans (Abs): 0 10*3/uL (ref 0.0–0.1)
Immature Granulocytes: 0 %
LYMPHS: 35 %
Lymphocytes Absolute: 2.1 10*3/uL (ref 0.7–3.1)
MCH: 28.6 pg (ref 26.6–33.0)
MCHC: 34.4 g/dL (ref 31.5–35.7)
MCV: 83 fL (ref 79–97)
MONOCYTES: 7 %
MONOS ABS: 0.4 10*3/uL (ref 0.1–0.9)
NEUTROS ABS: 3.4 10*3/uL (ref 1.4–7.0)
Neutrophils: 57 %
PLATELETS: 219 10*3/uL (ref 150–379)
RBC: 4.26 x10E6/uL (ref 3.77–5.28)
RDW: 14 % (ref 12.3–15.4)
RPR: NONREACTIVE
Rh Factor: POSITIVE
Rubella Antibodies, IGG: 1.91 index (ref >0.99)
WBC: 6 10*3/uL (ref 3.4–10.8)

## 2016-02-26 LAB — TOXASSURE SELECT 13 (MW), URINE: PDF: 0

## 2016-02-26 LAB — HEMOGLOBINOPATHY EVALUATION
HGB C: 0 %
HGB S: 39.7 % — AB
Hemoglobin A2 Quantitation: 4.1 % — ABNORMAL HIGH (ref 0.7–3.1)
Hemoglobin F Quantitation: 0 % (ref 0.0–2.0)
Hgb A: 56.2 % — ABNORMAL LOW (ref 94.0–98.0)

## 2016-02-26 LAB — VARICELLA ZOSTER ANTIBODY, IGG

## 2016-02-26 LAB — CYSTIC FIBROSIS MUTATION 97: Interpretation: NOT DETECTED

## 2016-02-26 LAB — HIV ANTIBODY (ROUTINE TESTING W REFLEX): HIV SCREEN 4TH GENERATION: NONREACTIVE

## 2016-03-18 ENCOUNTER — Encounter: Payer: PRIVATE HEALTH INSURANCE | Admitting: Obstetrics and Gynecology

## 2016-03-18 ENCOUNTER — Encounter: Payer: PRIVATE HEALTH INSURANCE | Admitting: Obstetrics & Gynecology

## 2016-04-01 ENCOUNTER — Encounter: Payer: PRIVATE HEALTH INSURANCE | Admitting: Obstetrics and Gynecology

## 2016-04-10 ENCOUNTER — Ambulatory Visit (INDEPENDENT_AMBULATORY_CARE_PROVIDER_SITE_OTHER): Payer: Medicaid Other | Admitting: Obstetrics and Gynecology

## 2016-04-10 VITALS — BP 115/68 | HR 75 | Temp 98.5°F | Wt 200.6 lb

## 2016-04-10 DIAGNOSIS — D573 Sickle-cell trait: Secondary | ICD-10-CM

## 2016-04-10 DIAGNOSIS — Z3402 Encounter for supervision of normal first pregnancy, second trimester: Secondary | ICD-10-CM

## 2016-04-10 DIAGNOSIS — Z283 Underimmunization status: Principal | ICD-10-CM

## 2016-04-10 DIAGNOSIS — O09899 Supervision of other high risk pregnancies, unspecified trimester: Secondary | ICD-10-CM

## 2016-04-10 NOTE — Progress Notes (Signed)
Patient states that she overall feels well. 

## 2016-04-10 NOTE — Progress Notes (Signed)
Subjective:  Tammie Mckee is a 20 y.o. G2P0010 at 3379w4d being seen today for ongoing prenatal care.  She is currently monitored for the following issues for this low-risk pregnancy and has Supervision of normal first pregnancy, antepartum; Sickle cell trait (HCC); and Susceptible to varicella (non-immune), currently pregnant on her problem list.  Patient reports no complaints.  Contractions: Not present. Vag. Bleeding: None.   . Denies leaking of fluid.   The following portions of the patient's history were reviewed and updated as appropriate: allergies, current medications, past family history, past medical history, past social history, past surgical history and problem list. Problem list updated.  Objective:   Vitals:   04/10/16 1105  BP: 115/68  Pulse: 75  Temp: 98.5 F (36.9 C)  Weight: 200 lb 9.6 oz (91 kg)    Fetal Status:           General:  Alert, oriented and cooperative. Patient is in no acute distress.  Skin: Skin is warm and dry. No rash noted.   Cardiovascular: Normal heart rate noted  Respiratory: Normal respiratory effort, no problems with respiration noted  Abdomen: Soft, gravid, appropriate for gestational age. Pain/Pressure: Absent     Pelvic:  Cervical exam deferred        Extremities: Normal range of motion.  Edema: None  Mental Status: Normal mood and affect. Normal behavior. Normal judgment and thought content.   Urinalysis:      Assessment and Plan:  Pregnancy: G2P0010 at 4879w4d  1. Susceptible to varicella (non-immune), currently pregnant   2. Sickle cell trait (HCC)   3. Supervision of normal first pregnancy, antepartum, second trimester Decline Flu vaccine Schedule U/S for anatomy  Preterm labor symptoms and general obstetric precautions including but not limited to vaginal bleeding, contractions, leaking of fluid and fetal movement were reviewed in detail with the patient. Please refer to After Visit Summary for other counseling  recommendations.  No Follow-up on file.   Hermina StaggersMichael L Ervin, MD

## 2016-04-11 ENCOUNTER — Encounter (HOSPITAL_COMMUNITY): Payer: Self-pay | Admitting: Obstetrics and Gynecology

## 2016-04-22 ENCOUNTER — Other Ambulatory Visit: Payer: Self-pay | Admitting: Obstetrics and Gynecology

## 2016-04-22 ENCOUNTER — Ambulatory Visit (HOSPITAL_COMMUNITY)
Admission: RE | Admit: 2016-04-22 | Discharge: 2016-04-22 | Disposition: A | Discharge status: Home / Self Care | Payer: Medicaid Other | Source: Ambulatory Visit | Attending: Obstetrics and Gynecology | Admitting: Obstetrics and Gynecology

## 2016-04-22 DIAGNOSIS — Z363 Encounter for antenatal screening for malformations: Secondary | ICD-10-CM | POA: Diagnosis not present

## 2016-04-22 DIAGNOSIS — Z3402 Encounter for supervision of normal first pregnancy, second trimester: Secondary | ICD-10-CM

## 2016-04-22 DIAGNOSIS — Z3A19 19 weeks gestation of pregnancy: Secondary | ICD-10-CM | POA: Diagnosis not present

## 2016-04-22 DIAGNOSIS — D573 Sickle-cell trait: Secondary | ICD-10-CM | POA: Diagnosis not present

## 2016-04-22 DIAGNOSIS — O99012 Anemia complicating pregnancy, second trimester: Secondary | ICD-10-CM | POA: Insufficient documentation

## 2016-05-01 ENCOUNTER — Ambulatory Visit (INDEPENDENT_AMBULATORY_CARE_PROVIDER_SITE_OTHER): Payer: Medicaid Other | Admitting: Obstetrics and Gynecology

## 2016-05-01 VITALS — BP 102/70 | HR 83 | Temp 97.6°F | Wt 199.8 lb

## 2016-05-01 DIAGNOSIS — Z283 Underimmunization status: Secondary | ICD-10-CM

## 2016-05-01 DIAGNOSIS — O09899 Supervision of other high risk pregnancies, unspecified trimester: Secondary | ICD-10-CM

## 2016-05-01 DIAGNOSIS — D573 Sickle-cell trait: Secondary | ICD-10-CM

## 2016-05-01 DIAGNOSIS — O99012 Anemia complicating pregnancy, second trimester: Secondary | ICD-10-CM

## 2016-05-01 DIAGNOSIS — Z3400 Encounter for supervision of normal first pregnancy, unspecified trimester: Secondary | ICD-10-CM

## 2016-05-01 NOTE — Progress Notes (Signed)
   PRENATAL VISIT NOTE  Subjective:  Tammie Mckee is a 20 y.o. G2P0010 at 8376w4d being seen today for ongoing prenatal care.  She is currently monitored for the following issues for this low-risk pregnancy and has Supervision of normal first pregnancy, antepartum; Sickle cell trait (HCC); and Susceptible to varicella (non-immune), currently pregnant on her problem list.  Patient reports URI without fevers and without productive cough.  Contractions: Not present. Vag. Bleeding: None.  Movement: Present. Denies leaking of fluid.   The following portions of the patient's history were reviewed and updated as appropriate: allergies, current medications, past family history, past medical history, past social history, past surgical history and problem list. Problem list updated.  Objective:   Vitals:   05/01/16 1040  BP: 102/70  Pulse: 83  Temp: 97.6 F (36.4 C)  Weight: 199 lb 12.8 oz (90.6 kg)    Fetal Status: Fetal Heart Rate (bpm): 150 Fundal Height: 20 cm Movement: Present     General:  Alert, oriented and cooperative. Patient is in no acute distress.  Skin: Skin is warm and dry. No rash noted.   Cardiovascular: Normal heart rate noted  Respiratory: Normal respiratory effort, no problems with respiration noted  Abdomen: Soft, gravid, appropriate for gestational age. Pain/Pressure: Absent     Pelvic:  Cervical exam deferred        Extremities: Normal range of motion.  Edema: None  Mental Status: Normal mood and affect. Normal behavior. Normal judgment and thought content.   Assessment and Plan:  Pregnancy: G2P0010 at 7376w4d  1. Susceptible to varicella (non-immune), currently pregnant Will offer postpartum  2. Supervision of normal first pregnancy, antepartum Advised to stay well hydrated. Reviewed OTC meds that she can take Patient desires flu vaccine but will give when her URI resolves  3. Sickle cell trait [facility](HCC) Partner has not been tested They may consider testing  postpartum  General obstetric precautions including but not limited to vaginal bleeding, contractions, leaking of fluid and fetal movement were reviewed in detail with the patient. Please refer to After Visit Summary for other counseling recommendations.  Return in about 4 weeks (around 05/29/2016).  Catalina AntiguaPeggy Maiah Sinning, MD

## 2016-05-01 NOTE — Progress Notes (Signed)
Patient is in the office and states that she has had a cold (coughing, nasal drainage) for about a week and would like a rx, but reports good fetal movement.

## 2016-05-18 ENCOUNTER — Inpatient Hospital Stay (HOSPITAL_COMMUNITY)
Admission: AD | Admit: 2016-05-18 | Discharge: 2016-05-18 | Disposition: A | Discharge status: Home / Self Care | Payer: Medicaid Other | Source: Ambulatory Visit | Attending: Obstetrics and Gynecology | Admitting: Obstetrics and Gynecology

## 2016-05-18 ENCOUNTER — Encounter (HOSPITAL_COMMUNITY): Payer: Self-pay | Admitting: Certified Nurse Midwife

## 2016-05-18 DIAGNOSIS — O9989 Other specified diseases and conditions complicating pregnancy, childbirth and the puerperium: Secondary | ICD-10-CM | POA: Diagnosis not present

## 2016-05-18 DIAGNOSIS — Z3A23 23 weeks gestation of pregnancy: Secondary | ICD-10-CM | POA: Diagnosis not present

## 2016-05-18 DIAGNOSIS — O99512 Diseases of the respiratory system complicating pregnancy, second trimester: Secondary | ICD-10-CM | POA: Insufficient documentation

## 2016-05-18 DIAGNOSIS — J322 Chronic ethmoidal sinusitis: Secondary | ICD-10-CM | POA: Diagnosis not present

## 2016-05-18 DIAGNOSIS — R0981 Nasal congestion: Secondary | ICD-10-CM | POA: Diagnosis present

## 2016-05-18 DIAGNOSIS — J329 Chronic sinusitis, unspecified: Secondary | ICD-10-CM | POA: Diagnosis not present

## 2016-05-18 LAB — URINALYSIS, ROUTINE W REFLEX MICROSCOPIC
BILIRUBIN URINE: NEGATIVE
Glucose, UA: NEGATIVE mg/dL
HGB URINE DIPSTICK: NEGATIVE
KETONES UR: NEGATIVE mg/dL
NITRITE: NEGATIVE
PH: 7.5 (ref 5.0–8.0)
Protein, ur: NEGATIVE mg/dL
Specific Gravity, Urine: 1.015 (ref 1.005–1.030)

## 2016-05-18 LAB — URINE MICROSCOPIC-ADD ON: RBC / HPF: NONE SEEN RBC/hpf (ref 0–5)

## 2016-05-18 MED ORDER — AZITHROMYCIN 250 MG PO TABS: 250.0000 mg | ORAL_TABLET | Freq: Every day | ORAL | 0 refills | Status: DC

## 2016-05-18 NOTE — Discharge Instructions (Signed)
Use Sudafed 12 hour  - over the counter as needed. Drink at least 8 8-oz glasses of water every day. Get your prescription from the pharmacy. Keep all of your scheduled appointments in the office. Call your doctor if you have fever or worsening symptoms.

## 2016-05-18 NOTE — MAU Provider Note (Signed)
History     CSN: [id number]  Arrival date and time: 05/18/16 0903   First Provider Initiated Contact with Patient 05/18/16 1040      Chief Complaint  Patient presents with  . Nasal Congestion   HPI Tammie Mckee 20 y.o. 7434w0d  Comes to MAU with headache, pressure behind her eyes, blowing out yellow and green sputum and periodic pain in right ear.  Was sick with URI 3 weeks ago and she has never fully gotten better.  The cough she had has resolved but now she has started with headaches and she has not had that previously.   OB History    Gravida Para Term Preterm AB Living   2 0 0 0 1 0   SAB TAB Ectopic Multiple Live Births   1 0 0 0 0      Past Medical History:  Diagnosis Date  . Medical history non-contributory     Past Surgical History:  Procedure Laterality Date  . KNEE SURGERY      Family History  Problem Relation Age of Onset  . Diabetes Father   . Hypertension Father     Social History  Substance Use Topics  . Smoking status: Never Smoker  . Smokeless tobacco: Never Used  . Alcohol use No     Comment: Occasional    Allergies: No Known Allergies  Prescriptions Prior to Admission  Medication Sig Dispense Refill Last Dose  . Prenatal Vit-Fe Fumarate-FA (PRENATAL MULTIVITAMIN) TABS tablet Take 1 tablet by mouth at bedtime.    05/17/2016 at Unknown time    Review of Systems  Constitutional: Negative for fever.  HENT: Positive for congestion, ear pain and sinus pain. Negative for sore throat.   Respiratory: Negative for cough.   Cardiovascular: Negative for chest pain.  Gastrointestinal: Negative for abdominal pain, nausea and vomiting.  Genitourinary:       No vaginal discharge. No vaginal bleeding. No dysuria.   Physical Exam   Blood pressure 117/57, pulse 79, temperature 98 F (36.7 C), temperature source Oral, resp. rate 18, last menstrual period 12/09/2015.  Physical Exam  Nursing note and vitals reviewed. Constitutional: She is oriented  to person, place, and time. She appears well-developed and well-nourished.  HENT:  Head: Normocephalic.  Mouth/Throat: Oropharynx is clear and moist. No oropharyngeal exudate.  TM bilaterally are very full and extended with good light reflex and no redness Nasal turbinates are mildly red and slightly boggy  Eyes: EOM are normal.  Conjunctiva pale pink bilaterally  Neck: Neck supple.  Cardiovascular: Normal rate, regular rhythm and normal heart sounds.   Respiratory: Effort normal and breath sounds normal.  Musculoskeletal: Normal range of motion.  Neurological: She is alert and oriented to person, place, and time.  Skin: Skin is warm and dry.  Psychiatric: She has a normal mood and affect.    MAU Course  Procedures Results for orders placed or performed during the hospital encounter of 05/18/16 (from the past 24 hour(s))  Urinalysis, Routine w reflex microscopic ([facility])     Status: Abnormal   Collection Time: 05/18/16  9:08 AM  Result Value Ref Range   Color, Urine YELLOW YELLOW   APPearance CLEAR CLEAR   Specific Gravity, Urine 1.015 1.005 - 1.030   pH 7.5 5.0 - 8.0   Glucose, UA NEGATIVE NEGATIVE mg/dL   Hgb urine dipstick NEGATIVE NEGATIVE   Bilirubin Urine NEGATIVE NEGATIVE   Ketones, ur NEGATIVE NEGATIVE mg/dL   Protein, ur NEGATIVE NEGATIVE mg/dL  Nitrite NEGATIVE NEGATIVE   Leukocytes, UA SMALL (A) NEGATIVE  Urine microscopic-add on     Status: Abnormal   Collection Time: 05/18/16  9:08 AM  Result Value Ref Range   Squamous Epithelial / LPF 6-30 (A) NONE SEEN   WBC, UA 0-5 0 - 5 WBC/hpf   RBC / HPF NONE SEEN 0 - 5 RBC/hpf   Bacteria, UA RARE (A) NONE SEEN   Urine-Other MUCOUS PRESENT     MDM Given history of upper respiratory infections with nasal congestion and cough 3 weeks ago, she likely has developed sinus infection at this point with symptoms now of headache and pressure behind her eyes with continued nasal congestion.  She still sounds very stuffy.   Will treat with antibiotics.  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 Drink at least 8 8-oz glasses of water every day. Get your prescription from the pharmacy. Keep all of your scheduled appointments in the office. Call your doctor if you have fever or worsening symptoms. Tammie Mckee L Mona Ayars 05/18/2016, 10:41 AM

## 2016-05-18 NOTE — MAU Note (Signed)
Pt states she nasal congestion for 4 weeks. Pt states she tried Robitussin for 1 week, taking only one dose per day. Pt states she has had zero relief. Pt denies fever or any other cold/flu symptoms. Pt denies ctxs, LOF, or vaginal bleeding. Fetus is active.

## 2016-05-20 ENCOUNTER — Emergency Department (HOSPITAL_COMMUNITY)
Admission: EM | Admit: 2016-05-20 | Discharge: 2016-05-21 | Disposition: A | Discharge status: Home / Self Care | Payer: Medicaid Other | Attending: Emergency Medicine | Admitting: Emergency Medicine

## 2016-05-20 ENCOUNTER — Encounter (HOSPITAL_COMMUNITY): Payer: Self-pay | Admitting: Emergency Medicine

## 2016-05-20 DIAGNOSIS — Y92410 Unspecified street and highway as the place of occurrence of the external cause: Secondary | ICD-10-CM | POA: Diagnosis not present

## 2016-05-20 DIAGNOSIS — Y9389 Activity, other specified: Secondary | ICD-10-CM | POA: Diagnosis not present

## 2016-05-20 DIAGNOSIS — Y999 Unspecified external cause status: Secondary | ICD-10-CM | POA: Insufficient documentation

## 2016-05-20 DIAGNOSIS — O9A212 Injury, poisoning and certain other consequences of external causes complicating pregnancy, second trimester: Secondary | ICD-10-CM | POA: Diagnosis present

## 2016-05-20 DIAGNOSIS — Z79899 Other long term (current) drug therapy: Secondary | ICD-10-CM | POA: Insufficient documentation

## 2016-05-20 DIAGNOSIS — R1030 Lower abdominal pain, unspecified: Secondary | ICD-10-CM | POA: Diagnosis not present

## 2016-05-20 DIAGNOSIS — Z3A23 23 weeks gestation of pregnancy: Secondary | ICD-10-CM | POA: Diagnosis not present
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
## 2016-05-20 NOTE — ED Triage Notes (Signed)
Pt is c/o sharp abd pain that started while waiting in the lobby

## 2016-05-20 NOTE — ED Notes (Signed)
ED Provider at bedside. 

## 2016-05-20 NOTE — Progress Notes (Signed)
Patient listed as having Medicaid insurance without a pcp.  EDCM spoke to patient at bedside.  Patient reports she is seen at the [facility].  EDCM provided patient with a list of providers who accept Medicaid in [place] to assist patient in establishing care with a pcp.  Patient thankful for services.  No further EDCM needs at this time.

## 2016-05-20 NOTE — ED Notes (Signed)
[facility] called for consult

## 2016-05-21 NOTE — ED Notes (Signed)
Informed by [facility] that pt is cleared from OB and that pt will need to follow up with OB.  Dr. Shawnie PonsPratt is the attending [facility] RN spoke with.
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
## 2016-05-21 NOTE — Discharge Instructions (Signed)
OB monitoring looked good today. Recommend to follow-up with your OBGYN as scheduled. May take tylenol for pain.

## 2016-05-29 ENCOUNTER — Ambulatory Visit (INDEPENDENT_AMBULATORY_CARE_PROVIDER_SITE_OTHER): Payer: Medicaid Other | Admitting: Obstetrics & Gynecology

## 2016-05-29 DIAGNOSIS — Z3402 Encounter for supervision of normal first pregnancy, second trimester: Secondary | ICD-10-CM

## 2016-05-29 DIAGNOSIS — Z23 Encounter for immunization: Secondary | ICD-10-CM | POA: Diagnosis not present

## 2016-05-29 DIAGNOSIS — Z3400 Encounter for supervision of normal first pregnancy, unspecified trimester: Secondary | ICD-10-CM

## 2016-05-29 NOTE — Progress Notes (Signed)
   PRENATAL VISIT NOTE  Subjective:  Tammie Mckee iHolley Dexters a 20 y.o. G2P0010 at 5017w4d being seen today for ongoing prenatal care.  She is currently monitored for the following issues for this low-risk pregnancy and has Supervision of normal first pregnancy, antepartum; Sickle cell trait (HCC); and Susceptible to varicella (non-immune), currently pregnant on her problem list.  Patient reports no complaints.  Contractions: Not present. Vag. Bleeding: None.  Movement: Present. Denies leaking of fluid.   The following portions of the patient's history were reviewed and updated as appropriate: allergies, current medications, past family history, past medical history, past social history, past surgical history and problem list. Problem list updated.  Objective:   Vitals:   05/29/16 0828  BP: 116/77  Pulse: 89  Temp: 97.9 F (36.6 C)  Weight: 202 lb 9.6 oz (91.9 kg)    Fetal Status: Fetal Heart Rate (bpm): 146   Movement: Present     General:  Alert, oriented and cooperative. Patient is in no acute distress.  Skin: Skin is warm and dry. No rash noted.   Cardiovascular: Normal heart rate noted  Respiratory: Normal respiratory effort, no problems with respiration noted  Abdomen: Soft, gravid, appropriate for gestational age. Pain/Pressure: Absent     Pelvic:  Cervical exam deferred        Extremities: Normal range of motion.  Edema: None  Mental Status: Normal mood and affect. Normal behavior. Normal judgment and thought content.   Assessment and Plan:  Pregnancy: G2P0010 at 217w4d  1. Supervision of normal first pregnancy, antepartum Flu vaccine today  Preterm labor symptoms and general obstetric precautions including but not limited to vaginal bleeding, contractions, leaking of fluid and fetal movement were reviewed in detail with the patient. Please refer to After Visit Summary for other counseling recommendations.  Return in about 4 years (around 05/29/2020) for 2h gtt.   Adam PhenixJames G  Jaxxon Naeem, MD

## 2016-05-29 NOTE — Progress Notes (Signed)
Patient states that she feels good today, reports good fetal movement. 

## 2016-06-26 ENCOUNTER — Other Ambulatory Visit: Payer: Medicaid Other

## 2016-06-26 ENCOUNTER — Ambulatory Visit (INDEPENDENT_AMBULATORY_CARE_PROVIDER_SITE_OTHER): Payer: Medicaid Other | Admitting: Obstetrics and Gynecology

## 2016-06-26 VITALS — BP 107/68 | HR 84 | Wt 210.8 lb

## 2016-06-26 DIAGNOSIS — Z23 Encounter for immunization: Secondary | ICD-10-CM | POA: Diagnosis not present

## 2016-06-26 DIAGNOSIS — Z2839 Other underimmunization status: Secondary | ICD-10-CM

## 2016-06-26 DIAGNOSIS — O09899 Supervision of other high risk pregnancies, unspecified trimester: Secondary | ICD-10-CM

## 2016-06-26 DIAGNOSIS — Z3400 Encounter for supervision of normal first pregnancy, unspecified trimester: Secondary | ICD-10-CM

## 2016-06-26 DIAGNOSIS — Z283 Underimmunization status: Secondary | ICD-10-CM

## 2016-06-26 DIAGNOSIS — Z3403 Encounter for supervision of normal first pregnancy, third trimester: Secondary | ICD-10-CM

## 2016-06-26 MED ORDER — TETANUS-DIPHTH-ACELL PERTUSSIS 5-2.5-18.5 LF-MCG/0.5 IM SUSP
0.5000 mL | Freq: Once | INTRAMUSCULAR | Status: AC
  Administered 2016-06-26: 0.5 mL via INTRAMUSCULAR

## 2016-06-26 NOTE — Patient Instructions (Signed)
Thinking About Waterbirth???  You must attend a Waterbirth class at [facility]  3rd Wednesday of every month from 7-9pm  Free  Register by calling [phone] or online at [email or web address]  Bring us the certificate from the class  Waterbirth supplies needed for [facility] Department patients:  Our practice has a Birth Pool in a Box tub at the hospital that you can borrow  You will need to purchase an accessory kit that has all needed supplies through [facility] ([phone]) or online $175.00  Or you can purchase the supplies separately: o Single-use disposable tub liner for Birth Pool in a Box (REGULAR size) o New garden hose labeled "lead-free", "suitable for drinking water", o Electric drain pump to remove water (We recommend 792 gallon per hour or greater pump.)  o  "non-toxic" OR "water potable" o Garden hose to remove the dirty water o Fish net o Bathing suit top (optional) o Long-handled mirror (optional)  [employer]com sells tubs for ~ $120 if you would rather purchase your own tub.  They also sell accessories, liners.    Www.waterbirthsolutions.com for tub purchases and supplies  [employer] (www.thelaborladies.com) $275 for tub rental/set-up & take down/kit   [employer] information regarding doulas (labor support) who provide pool rentals:  Http://www.padanc.org/MeetUs.htm   [employer] (www.thelaborladies.com)  Http://www.padanc.org/MeetUs.htm   Things that would prevent you from having a waterbirth:  Premature, <37wks  Previous cesarean birth  Presence of thick meconium-stained fluid  Multiple gestation (Twins, triplets, etc.)  Uncontrolled diabetes or gestational diabetes requiring medication  Hypertension  Heavy vaginal bleeding  Non-reassuring fetal heart rate  Active infection (MRSA, etc.)  If your labor has to be induced and induction method requires continuous  monitoring of the baby's heart rate  Other risks/issues identified by your obstetrical provider   

## 2016-06-26 NOTE — Progress Notes (Signed)
   PRENATAL VISIT NOTE  Subjective:  Tammie Mckee is a 20 y.o. G2P0010 at 1740w4d being seen today for ongoing prenatal care.  She is currently monitored for the following issues for this low-risk pregnancy and has Supervision of normal first pregnancy, antepartum; Sickle cell trait (HCC); and Susceptible to varicella (non-immune), currently pregnant on her problem list.  Patient reports no complaints.  Contractions: Not present. Vag. Bleeding: None.  Movement: Present. Denies leaking of fluid.   The following portions of the patient's history were reviewed and updated as appropriate: allergies, current medications, past family history, past medical history, past social history, past surgical history and problem list. Problem list updated.  Objective:   Vitals:   06/26/16 0836  BP: 107/68  Pulse: 84  Weight: 210 lb 12.8 oz (95.6 kg)    Fetal Status: Fetal Heart Rate (bpm): 152 Fundal Height: 28 cm Movement: Present     General:  Alert, oriented and cooperative. Patient is in no acute distress.  Skin: Skin is warm and dry. No rash noted.   Cardiovascular: Normal heart rate noted  Respiratory: Normal respiratory effort, no problems with respiration noted  Abdomen: Soft, gravid, appropriate for gestational age. Pain/Pressure: Absent     Pelvic:  Cervical exam deferred        Extremities: Normal range of motion.  Edema: None  Mental Status: Normal mood and affect. Normal behavior. Normal judgment and thought content.   Assessment and Plan:  Pregnancy: G2P0010 at 5340w4d  1. Supervision of normal first pregnancy, antepartum Patient is doing well without complaints Third trimeter labs, tdap and 2hr glucola today Patient agrees to tdap Patient is considering waterbirth. Information provided Patient is still researching pediatricians - CBC - RPR - HIV antibody - Glucose Tolerance, 2 Hours w/1 Hour  2. Susceptible to varicella (non-immune), currently pregnant Will offer  pp  Preterm labor symptoms and general obstetric precautions including but not limited to vaginal bleeding, contractions, leaking of fluid and fetal movement were reviewed in detail with the patient. Please refer to After Visit Summary for other counseling recommendations.  Return in about 2 weeks (around 07/10/2016).   Catalina AntiguaPeggy Jaskirat Zertuche, MD'

## 2016-06-26 NOTE — Progress Notes (Signed)
Patient states she had some pain yesterday. She also had a dizzy episode yesterday.

## 2016-06-27 LAB — GLUCOSE TOLERANCE, 2 HOURS W/ 1HR
GLUCOSE, 2 HOUR: 78 mg/dL (ref 65–152)
Glucose, 1 hour: 105 mg/dL (ref 65–179)
Glucose, Fasting: 77 mg/dL (ref 65–91)

## 2016-06-27 LAB — CBC
HEMATOCRIT: 33.9 % — AB (ref 34.0–46.6)
HEMOGLOBIN: 11.7 g/dL (ref 11.1–15.9)
MCH: 30.6 pg (ref 26.6–33.0)
MCHC: 34.5 g/dL (ref 31.5–35.7)
MCV: 89 fL (ref 79–97)
Platelets: 215 10*3/uL (ref 150–379)
RBC: 3.82 x10E6/uL (ref 3.77–5.28)
RDW: 13.9 % (ref 12.3–15.4)
WBC: 10.6 10*3/uL (ref 3.4–10.8)

## 2016-06-27 LAB — HIV ANTIBODY (ROUTINE TESTING W REFLEX): HIV Screen 4th Generation wRfx: NONREACTIVE

## 2016-06-27 LAB — RPR: RPR Ser Ql: NONREACTIVE

## 2016-07-14 NOTE — L&D Delivery Note (Signed)
Vaginal Delivery Note  21 y.o. G2P0010 at 3571w1d delivered a viable female infant at 1458 in cephalic, vertex OA position. No nuchal cord. Right anterior shoulder delivered with ease. Sixty sec delayed cord clamping. Cord clamped x2 and cut. Placenta delivered spontaneously intact, with 3VC. Fundus firm on exam with massage and pitocin.  Mother: Anesthesia: Epidural; local anesthesia for repair Laceration: 1st degree perineal with left labia extension and clitoral with extension toward urethra Suture repair: 3-0 Vicryl; 4-0 Monocryl EBL: 200 mL  Baby: Apgars: 9, 9 Weight: Pending Cord pH: Not sent  Good hemostasis noted. Mom to postpartum.  Baby to Couplet care / Skin to Skin.  Wendee Beaversavid J McMullen, DO, PGY-1 09/08/2016, 3:40 PM   OB FELLOW DELIVERY ATTESTATION  I was gloved and present for the delivery in its entirety, and I agree with the above resident's note.  I was present and performed the repair with the resident. Clitoral repair performed with straight urinary catheter in place to protect urethra, patent after finished. Good hemostasis noted.   Jen MowElizabeth Ross Bender, DO OB Fellow

## 2016-07-17 ENCOUNTER — Ambulatory Visit (INDEPENDENT_AMBULATORY_CARE_PROVIDER_SITE_OTHER): Payer: Medicaid Other | Admitting: Family Medicine

## 2016-07-17 DIAGNOSIS — Z3403 Encounter for supervision of normal first pregnancy, third trimester: Secondary | ICD-10-CM

## 2016-07-17 DIAGNOSIS — Z3400 Encounter for supervision of normal first pregnancy, unspecified trimester: Secondary | ICD-10-CM

## 2016-07-17 NOTE — Progress Notes (Signed)
Patient reports contractions that come and go, good fetal movement.

## 2016-07-17 NOTE — Progress Notes (Signed)
   PRENATAL VISIT NOTE  Subjective:  Tammie Mckee is a 21 y.o. G2P0010 at 6736w4d being seen today for ongoing prenatal care.  She is currently monitored for the following issues for this low-risk pregnancy and has Supervision of normal first pregnancy, antepartum; Sickle cell trait (HCC); and Susceptible to varicella (non-immune), currently pregnant on her problem list.  Patient reports occasional contractions.  Contractions: Irregular. Vag. Bleeding: None.  Movement: Present. Denies leaking of fluid.   The following portions of the patient's history were reviewed and updated as appropriate: allergies, current medications, past family history, past medical history, past social history, past surgical history and problem list. Problem list updated.  Objective:   Vitals:   07/17/16 1347  BP: 106/70  Pulse: 84  Weight: 215 lb (97.5 kg)    Fetal Status: Fetal Heart Rate (bpm): 157   Movement: Present     General:  Alert, oriented and cooperative. Patient is in no acute distress.  Skin: Skin is warm and dry. No rash noted.   Cardiovascular: Normal heart rate noted  Respiratory: Normal respiratory effort, no problems with respiration noted  Abdomen: Soft, gravid, appropriate for gestational age. Pain/Pressure: Absent     Pelvic:  Cervical exam deferred        Extremities: Normal range of motion.  Edema: Trace  Mental Status: Normal mood and affect. Normal behavior. Normal judgment and thought content.   Assessment and Plan:  Pregnancy: G2P0010 at 3736w4d  1. Supervision of normal first pregnancy, antepartum FHT and FH normal  Preterm labor symptoms and general obstetric precautions including but not limited to vaginal bleeding, contractions, leaking of fluid and fetal movement were reviewed in detail with the patient. Please refer to After Visit Summary for other counseling recommendations.  Return in about 2 weeks (around 07/31/2016) for OB f/u.   Levie HeritageJacob J Makell Drohan, DO

## 2016-07-25 ENCOUNTER — Encounter (HOSPITAL_COMMUNITY): Payer: Self-pay

## 2016-07-25 ENCOUNTER — Inpatient Hospital Stay (HOSPITAL_COMMUNITY)
Admission: AD | Admit: 2016-07-25 | Discharge: 2016-07-25 | Disposition: A | Discharge status: Home / Self Care | Payer: Medicaid Other | Source: Ambulatory Visit | Attending: Obstetrics & Gynecology | Admitting: Obstetrics & Gynecology

## 2016-07-25 DIAGNOSIS — O4693 Antepartum hemorrhage, unspecified, third trimester: Secondary | ICD-10-CM | POA: Insufficient documentation

## 2016-07-25 DIAGNOSIS — O98813 Other maternal infectious and parasitic diseases complicating pregnancy, third trimester: Secondary | ICD-10-CM | POA: Diagnosis not present

## 2016-07-25 DIAGNOSIS — Z3A32 32 weeks gestation of pregnancy: Secondary | ICD-10-CM

## 2016-07-25 DIAGNOSIS — B3731 Acute candidiasis of vulva and vagina: Secondary | ICD-10-CM

## 2016-07-25 DIAGNOSIS — B373 Candidiasis of vulva and vagina: Secondary | ICD-10-CM | POA: Diagnosis not present

## 2016-07-25 LAB — URINALYSIS, ROUTINE W REFLEX MICROSCOPIC
BILIRUBIN URINE: NEGATIVE
GLUCOSE, UA: NEGATIVE mg/dL
HGB URINE DIPSTICK: NEGATIVE
Ketones, ur: NEGATIVE mg/dL
NITRITE: NEGATIVE
PH: 6 (ref 5.0–8.0)
Protein, ur: NEGATIVE mg/dL
SPECIFIC GRAVITY, URINE: 1.008 (ref 1.005–1.030)

## 2016-07-25 LAB — WET PREP, GENITAL
Clue Cells Wet Prep HPF POC: NONE SEEN
SPERM: NONE SEEN
Trich, Wet Prep: NONE SEEN
Yeast Wet Prep HPF POC: NONE SEEN

## 2016-07-25 MED ORDER — TERCONAZOLE 0.8 % VA CREA: 1.0000 | TOPICAL_CREAM | Freq: Every day | VAGINAL | 0 refills | Status: DC

## 2016-07-25 NOTE — Discharge Instructions (Signed)
Vaginal Yeast infection, Adult Vaginal yeast infection is a condition that causes soreness, swelling, and redness (inflammation) of the vagina. It also causes vaginal discharge. This is a common condition. Some women get this infection frequently. What are the causes? This condition is caused by a change in the normal balance of the yeast (candida) and bacteria that live in the vagina. This change causes an overgrowth of yeast, which causes the inflammation. What increases the risk? This condition is more likely to develop in:  Women who take antibiotic medicines.  Women who have diabetes.  Women who take birth control pills.  Women who are pregnant.  Women who douche often.  Women who have a weak defense (immune) system.  Women who have been taking steroid medicines for a long time.  Women who frequently wear tight clothing. What are the signs or symptoms? Symptoms of this condition include:  White, thick vaginal discharge.  Swelling, itching, redness, and irritation of the vagina. The lips of the vagina (vulva) may be affected as well.  Pain or a burning feeling while urinating.  Pain during sex. How is this diagnosed? This condition is diagnosed with a medical history and physical exam. This will include a pelvic exam. Your health care provider will examine a sample of your vaginal discharge under a microscope. Your health care provider may send this sample for testing to confirm the diagnosis. How is this treated? This condition is treated with medicine. Medicines may be over-the-counter or prescription. You may be told to use one or more of the following:  Medicine that is taken orally.  Medicine that is applied as a cream.  Medicine that is inserted directly into the vagina (suppository). Follow these instructions at home:  Take or apply over-the-counter and prescription medicines only as told by your health care provider.  Do not have sex until your health care  provider has approved. Tell your sex partner that you have a yeast infection. That person should go to his or her health care provider if he or she develops symptoms.  Do not wear tight clothes, such as pantyhose or tight pants.  Avoid using tampons until your health care provider approves.  Eat more yogurt. This may help to keep your yeast infection from returning.  Try taking a sitz bath to help with discomfort. This is a warm water bath that is taken while you are sitting down. The water should only come up to your hips and should cover your buttocks. Do this 3-4 times per day or as told by your health care provider.  Do not douche.  Wear breathable, cotton underwear.  If you have diabetes, keep your blood sugar levels under control. Contact a health care provider if:  You have a fever.  Your symptoms go away and then return.  Your symptoms do not get better with treatment.  Your symptoms get worse.  You have new symptoms.  You develop blisters in or around your vagina.  You have blood coming from your vagina and it is not your menstrual period.  You develop pain in your abdomen. This information is not intended to replace advice given to you by your health care provider. Make sure you discuss any questions you have with your health care provider. Document Released: 04/09/2005 Document Revised: 12/12/2015 Document Reviewed: 01/01/2015 Elsevier Interactive Patient Education  2017 [employer] Preterm Labor and Birth Information The normal length of a pregnancy is 39-41 weeks. Preterm labor is when labor starts before 37 completed weeks  of pregnancy. What are the risk factors for preterm labor? Preterm labor is more likely to occur in women who:  Have certain infections during pregnancy such as a bladder infection, sexually transmitted infection, or infection inside the uterus (chorioamnionitis).  Have a shorter-than-normal cervix.  Have gone into preterm labor  before.  Have had surgery on their cervix.  Are younger than age 24 or older than age 32.  Are African American.  Are pregnant with twins or multiple babies (multiple gestation).  Take street drugs or smoke while pregnant.  Do not gain enough weight while pregnant.  Became pregnant shortly after having been pregnant. What are the symptoms of preterm labor? Symptoms of preterm labor include:  Cramps similar to those that can happen during a menstrual period. The cramps may happen with diarrhea.  Pain in the abdomen or lower back.  Regular uterine contractions that may feel like tightening of the abdomen.  A feeling of increased pressure in the pelvis.  Increased watery or bloody mucus discharge from the vagina.  Water breaking (ruptured amniotic sac). Why is it important to recognize signs of preterm labor? It is important to recognize signs of preterm labor because babies who are born prematurely may not be fully developed. This can put them at an increased risk for:  Long-term (chronic) heart and lung problems.  Difficulty immediately after birth with regulating body systems, including blood sugar, body temperature, heart rate, and breathing rate.  Bleeding in the brain.  Cerebral palsy.  Learning difficulties.  Death. These risks are highest for babies who are born before 34 weeks of pregnancy. How is preterm labor treated? Treatment depends on the length of your pregnancy, your condition, and the health of your baby. It may involve:  Having a stitch (suture) placed in your cervix to prevent your cervix from opening too early (cerclage).  Taking or being given medicines, such as:  Hormone medicines. These may be given early in pregnancy to help support the pregnancy.  Medicine to stop contractions.  Medicines to help mature the babys lungs. These may be prescribed if the risk of delivery is high.  Medicines to prevent your baby from developing cerebral  palsy. If the labor happens before 34 weeks of pregnancy, you may need to stay in the hospital. What should I do if I think I am in preterm labor? If you think that you are going into preterm labor, call your health care provider right away. How can I prevent preterm labor in future pregnancies? To increase your chance of having a full-term pregnancy:  Do not use any tobacco products, such as cigarettes, chewing tobacco, and e-cigarettes. If you need help quitting, ask your health care provider.  Do not use street drugs or medicines that have not been prescribed to you during your pregnancy.  Talk with your health care provider before taking any herbal supplements, even if you have been taking them regularly.  Make sure you gain a healthy amount of weight during your pregnancy.  Watch for infection. If you think that you might have an infection, get it checked right away.  Make sure to tell your health care provider if you have gone into preterm labor before. This information is not intended to replace advice given to you by your health care provider. Make sure you discuss any questions you have with your health care provider. Document Released: 09/20/2003 Document Revised: 12/11/2015 Document Reviewed: 11/21/2015 Elsevier Interactive Patient Education  2017 [employer].

## 2016-07-25 NOTE — MAU Provider Note (Signed)
History     CSN: [id number]  Arrival date and time: 07/25/16 1859   First Provider Initiated Contact with Patient 07/25/16 2003      Chief Complaint  Patient presents with  . Vaginal Bleeding   HPI Dorenda Pfannenstiel is a 21 y.o. G2P0010 at [redacted]w[redacted]d who presents with vaginal bleeding. Reports seeing dark brown dried blood in her underwear earlier today; no bleeding since. Denies abdominal pain, contractions, LOF, vaginal discharge, n/v/d, or recent intercourse. Last BM yesterday; no straining. Some vaginal irritation & itching for the last week. Positive fetal movement.   OB History    Gravida Para Term Preterm AB Living   2 0 0 0 1 0   SAB TAB Ectopic Multiple Live Births   1 0 0 0 0      Past Medical History:  Diagnosis Date  . Medical history non-contributory     Past Surgical History:  Procedure Laterality Date  . KNEE SURGERY      Family History  Problem Relation Age of Onset  . Diabetes Father   . Hypertension Father     Social History  Substance Use Topics  . Smoking status: Never Smoker  . Smokeless tobacco: Never Used  . Alcohol use No     Comment: Occasional    Allergies: No Known Allergies  Prescriptions Prior to Admission  Medication Sig Dispense Refill Last Dose  . Prenatal Vit-Fe Fumarate-FA (PRENATAL MULTIVITAMIN) TABS tablet Take 1 tablet by mouth at bedtime.    07/24/2016 at Unknown time    Review of Systems  Constitutional: Negative.   Gastrointestinal: Negative.   Genitourinary: Positive for vaginal bleeding. Negative for dysuria, hematuria, vaginal discharge and vaginal pain.       + vaginal irritation/itching   Physical Exam   Blood pressure 111/70, pulse 79, temperature 97.8 F (36.6 C), resp. rate 18, height 5' 3.5" (1.613 m), weight 218 lb 9.6 oz (99.2 kg), last menstrual period 12/09/2015.  Physical Exam  Nursing note and vitals reviewed. Constitutional: She is oriented to person, place, and time. She appears well-developed and  well-nourished. No distress.  HENT:  Head: Normocephalic and atraumatic.  Eyes: Conjunctivae are normal. Right eye exhibits no discharge. Left eye exhibits no discharge. No scleral icterus.  Neck: Normal range of motion.  Cardiovascular: Normal rate, regular rhythm and normal heart sounds.   No murmur heard. Respiratory: Effort normal and breath sounds normal. No respiratory distress. She has no wheezes.  GI: Soft. There is no tenderness.  Genitourinary: There is erythema in the vagina. No bleeding in the vagina. Vaginal discharge (small amount of clumpy white discharge adherant to vaginal walls & cervix) found.  Neurological: She is alert and oriented to person, place, and time.  Skin: Skin is warm and dry. She is not diaphoretic.  Psychiatric: She has a normal mood and affect. Her behavior is normal. Judgment and thought content normal.   Dilation: 1 Effacement (%): 50 Cervical Position: Posterior Station: -3 Exam by:: Judeth Horn NP  Fetal Tracing:  Baseline: 150 Variability: moderate Accelerations: + Decelerations: -  Toco: UI  MAU Course  Procedures Results for orders placed or performed during the hospital encounter of 07/25/16 (from the past 24 hour(s))  Urinalysis, Routine w reflex microscopic     Status: Abnormal   Collection Time: 07/25/16  7:25 PM  Result Value Ref Range   Color, Urine YELLOW YELLOW   APPearance CLEAR CLEAR   Specific Gravity, Urine 1.008 1.005 - 1.030   pH 6.0  5.0 - 8.0   Glucose, UA NEGATIVE NEGATIVE mg/dL   Hgb urine dipstick NEGATIVE NEGATIVE   Bilirubin Urine NEGATIVE NEGATIVE   Ketones, ur NEGATIVE NEGATIVE mg/dL   Protein, ur NEGATIVE NEGATIVE mg/dL   Nitrite NEGATIVE NEGATIVE   Leukocytes, UA SMALL (A) NEGATIVE   RBC / HPF 0-5 0 - 5 RBC/hpf   WBC, UA 0-5 0 - 5 WBC/hpf   Bacteria, UA RARE (A) NONE SEEN   Squamous Epithelial / LPF 0-5 (A) NONE SEEN   Mucous PRESENT   Wet prep, genital     Status: Abnormal   Collection Time:  07/25/16  8:17 PM  Result Value Ref Range   Yeast Wet Prep HPF POC NONE SEEN NONE SEEN   Trich, Wet Prep NONE SEEN NONE SEEN   Clue Cells Wet Prep HPF POC NONE SEEN NONE SEEN   WBC, Wet Prep HPF POC MODERATE (A) NONE SEEN   Sperm NONE SEEN     MDM Category 1 tracing Wet prep negative -- will tx for yeast based on exam & symptoms SVE unchanged after 1 hour & UI resolved with PO hydration S/w Dr. Despina HiddenEure. Ok to discharge home.  Assessment and Plan  A; 1. Vaginal yeast infection    P: Discharge home Rx terazol Preterm labor precautions Keep f/u with OB Discussed reasons to return to MAU  Judeth HornErin Jeniyah Menor 07/25/2016, 8:03 PM

## 2016-07-25 NOTE — MAU Note (Signed)
Spotting earlier today that was dark red/ brownish. Happened one time today. Happened one other time earlier in pregnancy but then no more.  No pain. No LOF.

## 2016-07-26 ENCOUNTER — Inpatient Hospital Stay (HOSPITAL_COMMUNITY)
Admission: AD | Admit: 2016-07-26 | Discharge: 2016-07-26 | Disposition: A | Discharge status: Home / Self Care | Payer: Medicaid Other | Source: Ambulatory Visit | Attending: Family Medicine | Admitting: Family Medicine

## 2016-07-26 ENCOUNTER — Encounter (HOSPITAL_COMMUNITY): Payer: Self-pay | Admitting: Certified Nurse Midwife

## 2016-07-26 DIAGNOSIS — Z833 Family history of diabetes mellitus: Secondary | ICD-10-CM | POA: Insufficient documentation

## 2016-07-26 DIAGNOSIS — O09899 Supervision of other high risk pregnancies, unspecified trimester: Secondary | ICD-10-CM

## 2016-07-26 DIAGNOSIS — Z8249 Family history of ischemic heart disease and other diseases of the circulatory system: Secondary | ICD-10-CM | POA: Insufficient documentation

## 2016-07-26 DIAGNOSIS — O26899 Other specified pregnancy related conditions, unspecified trimester: Secondary | ICD-10-CM

## 2016-07-26 DIAGNOSIS — O368130 Decreased fetal movements, third trimester, not applicable or unspecified: Secondary | ICD-10-CM | POA: Diagnosis present

## 2016-07-26 DIAGNOSIS — Z3400 Encounter for supervision of normal first pregnancy, unspecified trimester: Secondary | ICD-10-CM

## 2016-07-26 DIAGNOSIS — N898 Other specified noninflammatory disorders of vagina: Secondary | ICD-10-CM | POA: Diagnosis not present

## 2016-07-26 DIAGNOSIS — Z283 Underimmunization status: Secondary | ICD-10-CM

## 2016-07-26 DIAGNOSIS — Z3A32 32 weeks gestation of pregnancy: Secondary | ICD-10-CM | POA: Diagnosis not present

## 2016-07-26 NOTE — MAU Note (Signed)
Patient presents to MAU with brown discharge and minimum FM. Patients states she was here yesterday for spotting. Denies contractions and leaking of fluid. States baby has moved 3 times this past hour.

## 2016-07-26 NOTE — MAU Provider Note (Signed)
History     CSN: [id number]  Arrival date and time: 07/26/16 2058   None     Chief Complaint  Patient presents with  . Vaginal Discharge  . Decreased Fetal Movement   HPI  Patient is a 21 y.o. G2P0010 at. [redacted]w[redacted]d who presents with brown vaginal discharge and decreased fetal movement. Patient states that the brown discharge is the same as yesterday when she was seen in the MAU. She was diagnosed with a yeast infection and was given a prescription. She has not picked up the prescription yet. She also notes that her baby was not moving as much today but since she has been in the MAU she has felt more fetal movement. Denies any recent sexual intercourse. Denies any gush of fluid, fevers, chills, nausea, vomiting, diarrhea.   OB History    Gravida Para Term Preterm AB Living   2 0 0 0 1 0   SAB TAB Ectopic Multiple Live Births   1 0 0 0 0      Past Medical History:  Diagnosis Date  . Medical history non-contributory     Past Surgical History:  Procedure Laterality Date  . KNEE SURGERY      Family History  Problem Relation Age of Onset  . Diabetes Father   . Hypertension Father     Social History  Substance Use Topics  . Smoking status: Never Smoker  . Smokeless tobacco: Never Used  . Alcohol use No     Comment: Occasional    Allergies: No Known Allergies  Prescriptions Prior to Admission  Medication Sig Dispense Refill Last Dose  . guaifenesin (ROBITUSSIN) 100 MG/5ML syrup Take 200 mg by mouth.   07/26/2016 at 1000  . Prenatal Vit-Fe Fumarate-FA (PRENATAL MULTIVITAMIN) TABS tablet Take 1 tablet by mouth at bedtime.    07/24/2016 at Unknown time  . terconazole (TERAZOL 3) 0.8 % vaginal cream Place 1 applicator vaginally at bedtime. 20 g 0     Review of Systems: See history of present illness Physical Exam   Blood pressure 113/60, pulse 88, temperature 97.8 F (36.6 C), temperature source Oral, resp. rate 16, last menstrual period 12/09/2015.  Physical Exam   Constitutional: She appears well-developed and well-nourished.  HENT:  Head: Normocephalic and atraumatic.  Right Ear: External ear normal.  Left Ear: External ear normal.  Mouth/Throat: Oropharynx is clear and moist.  Eyes: Pupils are equal, round, and reactive to light.  Neck: Normal range of motion.  Cardiovascular: Normal rate, normal heart sounds and intact distal pulses.   Respiratory: Effort normal and breath sounds normal. No respiratory distress.  GI: Soft. Bowel sounds are normal. She exhibits no distension. There is no tenderness.  Genitourinary:  Genitourinary Comments: External genitalia within normal limits.  Vaginal mucosa pink, moist, normal rugae.  Nonfriable cervix without lesions, no discharge or bleeding noted on speculum exam.  Dilation: 1 Effacement (%): Thick Station: -3 Exam by:: Dr Jonathon Jordan   Musculoskeletal: Normal range of motion. She exhibits no edema.  Neurological: She is alert. Coordination normal.  Skin: Skin is warm and dry.    MAU Course  Procedures  MDM Vital signs within normal limits EFM: 140 bpm, moderate variability with good accelerations and no decelerations Speculum exam within normal limits, cervical exam showing 1 cm/thick/-3  Assessment and Plan  Vaginal discharge: No signs of labor, contractions, or blood on speculum exam. Did not repeat wet prep as this was done yesterday and was negative. Yesterday at the MAU it  was noted that patient's speculum exam most consistent with yeast and she was given a prescription for cream. Patient's symptoms are likely from her yeast infection. -Encouraged patient to pick up her prescription for her yeast infection at the pharmacy -Labor and return precautions discussed -Follow-up with OB   Reported decreased fetal movement: EFM reactive and within normal limits. Patient noted that her baby was moving more now that she is at the MAU. -Discussed return precautions  -kick counts  Beaulah DinningChristina M  Gambino 07/26/2016, 9:47 PM   OB FELLOW DISCHARGE ATTESTATION  I have seen and examined this patient and agree with above documentation in the resident's note.   Ernestina PennaNicholas Schenk, MD 1:49 AM

## 2016-07-26 NOTE — Discharge Instructions (Signed)
Introduction Patient Name: ________________________________________________ Patient Due Date: ____________________ What is a fetal movement count? A fetal movement count is the number of times that you feel your baby move during a certain amount of time. This may also be called a fetal kick count. A fetal movement count is recommended for every pregnant woman. You may be asked to start counting fetal movements as early as week 28 of your pregnancy. Pay attention to when your baby is most active. You may notice your baby's sleep and wake cycles. You may also notice things that make your baby move more. You should do a fetal movement count:  When your baby is normally most active.  At the same time each day. A good time to count movements is while you are resting, after having something to eat and drink. How do I count fetal movements? 1. Find a quiet, comfortable area. Sit, or lie down on your side. 2. Write down the date, the start time and stop time, and the number of movements that you felt between those two times. Take this information with you to your health care visits. 3. For 2 hours, count kicks, flutters, swishes, rolls, and jabs. You should feel at least 10 movements during 2 hours. 4. You may stop counting after you have felt 10 movements. 5. If you do not feel 10 movements in 2 hours, have something to eat and drink. Then, keep resting and counting for 1 hour. If you feel at least 4 movements during that hour, you may stop counting. Contact a health care provider if:  You feel fewer than 4 movements in 2 hours.  Your baby is not moving like he or she usually does. Date: ____________ Start time: ____________ Stop time: ____________ Movements: ____________ Date: ____________ Start time: ____________ Stop time: ____________ Movements: ____________ Date: ____________ Start time: ____________ Stop time: ____________ Movements: ____________ Date: ____________ Start time: ____________  Stop time: ____________ Movements: ____________ Date: ____________ Start time: ____________ Stop time: ____________ Movements: ____________ Date: ____________ Start time: ____________ Stop time: ____________ Movements: ____________ Date: ____________ Start time: ____________ Stop time: ____________ Movements: ____________ Date: ____________ Start time: ____________ Stop time: ____________ Movements: ____________ Date: ____________ Start time: ____________ Stop time: ____________ Movements: ____________ This information is not intended to replace advice given to you by your health care provider. Make sure you discuss any questions you have with your health care provider. Document Released: 07/30/2006 Document Revised: 02/27/2016 Document Reviewed: 08/09/2015 Elsevier Interactive Patient Education  2017 Elsevier Inc.  

## 2016-07-31 ENCOUNTER — Encounter: Payer: Medicaid Other | Admitting: Obstetrics & Gynecology

## 2016-08-07 ENCOUNTER — Encounter (HOSPITAL_COMMUNITY): Payer: Self-pay | Admitting: *Deleted

## 2016-08-07 ENCOUNTER — Inpatient Hospital Stay (HOSPITAL_COMMUNITY)
Admission: AD | Admit: 2016-08-07 | Discharge: 2016-08-07 | Disposition: A | Discharge status: Home / Self Care | Payer: Medicaid Other | Source: Ambulatory Visit | Attending: Obstetrics and Gynecology | Admitting: Obstetrics and Gynecology

## 2016-08-07 DIAGNOSIS — Z833 Family history of diabetes mellitus: Secondary | ICD-10-CM | POA: Insufficient documentation

## 2016-08-07 DIAGNOSIS — Z8249 Family history of ischemic heart disease and other diseases of the circulatory system: Secondary | ICD-10-CM | POA: Insufficient documentation

## 2016-08-07 DIAGNOSIS — Z3A34 34 weeks gestation of pregnancy: Secondary | ICD-10-CM | POA: Diagnosis not present

## 2016-08-07 DIAGNOSIS — O09899 Supervision of other high risk pregnancies, unspecified trimester: Secondary | ICD-10-CM

## 2016-08-07 DIAGNOSIS — Z3400 Encounter for supervision of normal first pregnancy, unspecified trimester: Secondary | ICD-10-CM

## 2016-08-07 DIAGNOSIS — O26893 Other specified pregnancy related conditions, third trimester: Secondary | ICD-10-CM | POA: Insufficient documentation

## 2016-08-07 DIAGNOSIS — Z283 Underimmunization status: Secondary | ICD-10-CM

## 2016-08-07 DIAGNOSIS — R102 Pelvic and perineal pain: Secondary | ICD-10-CM | POA: Diagnosis not present

## 2016-08-07 DIAGNOSIS — D573 Sickle-cell trait: Secondary | ICD-10-CM | POA: Insufficient documentation

## 2016-08-07 DIAGNOSIS — R1030 Lower abdominal pain, unspecified: Secondary | ICD-10-CM

## 2016-08-07 HISTORY — DX: Sickle-cell trait: D57.3

## 2016-08-07 LAB — URINALYSIS, ROUTINE W REFLEX MICROSCOPIC
BILIRUBIN URINE: NEGATIVE
Glucose, UA: NEGATIVE mg/dL
HGB URINE DIPSTICK: NEGATIVE
Ketones, ur: NEGATIVE mg/dL
Leukocytes, UA: NEGATIVE
Nitrite: NEGATIVE
PH: 7 (ref 5.0–8.0)
Protein, ur: NEGATIVE mg/dL
SPECIFIC GRAVITY, URINE: 1.009 (ref 1.005–1.030)

## 2016-08-07 NOTE — MAU Provider Note (Signed)
Obstetric Resident MAU Note  Chief Complaint:  Abdominal Pain   First Provider Initiated Contact with Patient 08/07/16 0945     HPI: Tammie Mckee is a 21 y.o. G2P0010 at 1953w4d who presents to maternity admissions reporting lower pelvic pain and pressure. She reports the pain started this morning, was very sharp and in the suprapubic region. She said it made her double over her bed. She then proceeded to get ready for work. When she got to work she was still having some discomfort and decided to come in for further evaluation. She denies any dysuria, new onset urinary urgency or frequency. No nausea or vomiting and the pain is essentially resolved at this time.   Denies regular contractions, leakage of fluid or vaginal bleeding. Good fetal movement.   [facility] Active Problem List   Diagnosis Date Noted  . Susceptible to varicella (non-immune), currently pregnant 02/26/2016  . Sickle cell trait (HCC) 02/19/2016  . Supervision of normal first pregnancy, antepartum 02/18/2016    Past Medical History:  Diagnosis Date  . Sickle cell trait (HCC)     OB History  Gravida Para Term Preterm AB Living  2 0 0 0 1 0  SAB TAB Ectopic Multiple Live Births  1 0 0 0 0    # Outcome Date GA Lbr Len/2nd Weight Sex Delivery Anes PTL Lv  2 Current           1 SAB              Birth Comments: System Generated. Please review and update pregnancy details.      Past Surgical History:  Procedure Laterality Date  . KNEE SURGERY      Family History: Family History  Problem Relation Age of Onset  . Diabetes Father   . Hypertension Father     Social History: Social History  Substance Use Topics  . Smoking status: Never Smoker  . Smokeless tobacco: Never Used  . Alcohol use No     Comment: Occasional    Allergies: No Known Allergies  No prescriptions prior to admission.    ROS: Pertinent findings in history of present illness.  Physical Exam   Blood pressure 107/66, pulse 91, temperature 97.7 F (36.5 C), temperature source Oral, resp. rate 18, last menstrual period 12/09/2015. CONSTITUTIONAL: Well-developed, well-nourished female in no acute distress.  HENT:  Normocephalic, atraumatic, Moist mucus membranes EYES: Normal conjunctivae. No scleral icterus.  NECK: Normal range of motion, supple. SKIN: Skin is warm and dry. No rash noted. Not diaphoretic. No erythema. No pallor. NEUROLGIC: Alert and oriented to person, place, and time. No focal defects PSYCHIATRIC: Normal mood and affect. Normal behavior. Normal judgment and thought content. CARDIOVASCULAR: Normal heart rate noted, regular rhythm RESPIRATORY: No increased work of breathing, stable on room air. LCTAB.  ABDOMEN: Soft, nontender, nondistended, gravid appropriate for gestational age MUSCULOSKELETAL: Normal range of motion. No edema and no tenderness. 2+ distal pulses.  Dilation: 1 Effacement (%): 50 Cervical Position: Middle Station: -2 Presentation: Vertex Exam by:: Dr. Orvan Falconerampbell, J. Rana SnareLowe RN  FHT:  Baseline 140s , moderate variability, accelerations present, no decelerations Contractions: no regular contractions, some uterine irritability is noted   Labs: Results for orders placed or performed during the hospital encounter of 08/07/16 (from the past 24 hour(s))  Urinalysis, Routine w reflex microscopic     Status: None   Collection Time: 08/07/16  9:15 AM  Result Value Ref Range   Color, Urine YELLOW  YELLOW   APPearance CLEAR CLEAR   Specific Gravity, Urine 1.009 1.005 - 1.030   pH 7.0 5.0 - 8.0   Glucose, UA NEGATIVE NEGATIVE mg/dL   Hgb urine dipstick NEGATIVE NEGATIVE   Bilirubin Urine NEGATIVE NEGATIVE   Ketones, ur NEGATIVE NEGATIVE mg/dL   Protein, ur NEGATIVE NEGATIVE mg/dL   Nitrite NEGATIVE NEGATIVE   Leukocytes, UA NEGATIVE NEGATIVE    Imaging:  No results found.  MAU Course: Patient presented with suprapubic abdominal discomfort and  pressure. The sensation was essentially resolved at the time of my exam. No regular contractions were noted on the monitor, only some mild uterine irritability. FHT was reactive. UA was without any findings concerning for UTI. Cervical exam revealed a cervix that was dilated 1cm which is consistent with her exam a week ago.   Assessment: 1. Lower abdominal pain   2. Susceptible to varicella (non-immune), currently pregnant   3. Supervision of normal first pregnancy, antepartum   Suspect that patient may have experienced a contraction, uterine irritability, or significant fetal movement this morning that resulted in abdominal pain. No bleeding or trauma concerning for abruption. UA without findings concerning for UTI. No discharge concerning for Bv, candidiaiss, trichomonas, or GC/Chlamydia. Symptoms also self-resolved. FHT Cat I.   Plan: Discharge home Labor precautions reviewed Follow up with OB provider  Follow-up Information    [facility] [place] Follow up.   Specialty:  Obstetrics and Gynecology Why:  Follow-up at previously scheduled appointment Contact information: [address] [place] [address] [phone]          Allergies as of 08/07/2016   No Known Allergies     Medication List    STOP taking these medications   terconazole 0.8 % vaginal cream Commonly known as:  TERAZOL 3     TAKE these medications   prenatal multivitamin Tabs tablet Take 1 tablet by mouth at bedtime.   sodium chloride 0.65 % Soln nasal spray Commonly known as:  OCEAN Place 1 spray into both nostrils as needed for congestion.       Lise Auer, MD PGY-2 08/07/2016 10:39 AM   OB FELLOW HISTORY AND PHYSICAL ATTESTATION  I have seen and examined this patient; I agree with above documentation in the resident's note.    Ernestina Penna 08/07/2016, 12:31 PM

## 2016-08-07 NOTE — Discharge Instructions (Signed)
Abdominal Pain During Pregnancy °Belly (abdominal) pain is common during pregnancy. Most of the time, it is not a serious problem. Other times, it can be a sign that something is wrong with the pregnancy. Always tell your doctor if you have belly pain. °Follow these instructions at home: °Monitor your belly pain for any changes. The following actions may help you feel better: °· Do not have sex (intercourse) or put anything in your vagina until you feel better. °· Rest until your pain stops. °· Drink clear fluids if you feel sick to your stomach (nauseous). Do not eat solid food until you feel better. °· Only take medicine as told by your doctor. °· Keep all doctor visits as told. °Get help right away if: °· You are bleeding, leaking fluid, or pieces of tissue come out of your vagina. °· You have more pain or cramping. °· You keep throwing up (vomiting). °· You have pain when you pee (urinate) or have blood in your pee. °· You have a fever. °· You do not feel your baby moving as much. °· You feel very weak or feel like passing out. °· You have trouble breathing, with or without belly pain. °· You have a very bad headache and belly pain. °· You have fluid leaking from your vagina and belly pain. °· You keep having watery poop (diarrhea). °· Your belly pain does not go away after resting, or the pain gets worse. °This information is not intended to replace advice given to you by your health care provider. Make sure you discuss any questions you have with your health care provider. °Document Released: 06/18/2009 Document Revised: 02/06/2016 Document Reviewed: 01/27/2013 °Elsevier Interactive Patient Education © 2017 Elsevier Inc. ° °

## 2016-08-07 NOTE — MAU Note (Signed)
Pt had trouble sleeping, had a lot of lower abd discomfort & tightening, unsure if contractions.  Felt this every 15-20 minutes for a while this morning.  Denies bleeding or LOF.

## 2016-08-14 ENCOUNTER — Ambulatory Visit (INDEPENDENT_AMBULATORY_CARE_PROVIDER_SITE_OTHER): Payer: Medicaid Other | Admitting: Obstetrics

## 2016-08-14 ENCOUNTER — Encounter: Payer: Self-pay | Admitting: Obstetrics

## 2016-08-14 VITALS — BP 104/73 | HR 84 | Wt 217.8 lb

## 2016-08-14 DIAGNOSIS — Z3480 Encounter for supervision of other normal pregnancy, unspecified trimester: Secondary | ICD-10-CM

## 2016-08-14 DIAGNOSIS — Z3483 Encounter for supervision of other normal pregnancy, third trimester: Secondary | ICD-10-CM

## 2016-08-14 DIAGNOSIS — Z3400 Encounter for supervision of normal first pregnancy, unspecified trimester: Secondary | ICD-10-CM

## 2016-08-14 NOTE — Progress Notes (Signed)
Patient was seen at MAU for discomfort- she is doing better

## 2016-08-14 NOTE — Progress Notes (Signed)
Subjective:  Tammie Mckee is a 21 y.o. G2P0010 at 329w4d being seen today for ongoing prenatal care.  She is currently monitored for the following issues for this low-risk pregnancy and has Supervision of normal first pregnancy, antepartum; Sickle cell trait (HCC); and Susceptible to varicella (non-immune), currently pregnant on her problem list.  Patient reports occasional contractions.  Contractions: Not present. Vag. Bleeding: None.  Movement: Present. Denies leaking of fluid.   The following portions of the patient's history were reviewed and updated as appropriate: allergies, current medications, past family history, past medical history, past social history, past surgical history and problem list. Problem list updated.  Objective:   Vitals:   08/14/16 0920  BP: 104/73  Pulse: 84  Weight: 217 lb 12.8 oz (98.8 kg)    Fetal Status: Fetal Heart Rate (bpm): 150   Movement: Present     General:  Alert, oriented and cooperative. Patient is in no acute distress.  Skin: Skin is warm and dry. No rash noted.   Cardiovascular: Normal heart rate noted  Respiratory: Normal respiratory effort, no problems with respiration noted  Abdomen: Soft, gravid, appropriate for gestational age. Pain/Pressure: Absent     Pelvic:  Cervical exam deferred        Extremities: Normal range of motion.  Edema: None  Mental Status: Normal mood and affect. Normal behavior. Normal judgment and thought content.   Urinalysis:      Assessment and Plan:  Pregnancy: G2P0010 at [redacted]w[redacted]d  1. Supervision of normal first pregnancy, antepartum Labs: - Strep Gp B NAA  Preterm labor symptoms and general obstetric precautions including but not limited to vaginal bleeding, contractions, leaking of fluid and fetal movement were reviewed in detail with the patient. Please refer to After Visit Summary for other counseling recommendations.  No Follow-up on file.   Brock Badharles A Harper, MDPatient ID: Tammie Mckee, female   DOB:  Oct 14, 1995, 21 y.o.   MRN: [id number]

## 2016-08-15 LAB — OB RESULTS CONSOLE GBS: STREP GROUP B AG: POSITIVE

## 2016-08-16 LAB — STREP GP B NAA: Strep Gp B NAA: POSITIVE — AB

## 2016-08-19 IMAGING — US US OB TRANSVAGINAL
1 series · 13 of 28 positions shown · non-contrast
Comparison: None.

CLINICAL DATA: Pelvic pain and shortness of breath with exertion
for 1 day. Estimated gestational age by LMP is 6 weeks 4 days.
Quantitative beta HCG is [DATE].

EXAM:
OBSTETRIC <14 WK US AND TRANSVAGINAL OB US
TECHNIQUE: Both transabdominal and transvaginal ultrasound examinations were
performed for complete evaluation of the gestation as well as the
maternal uterus, adnexal regions, and pelvic cul-de-sac.
Transvaginal technique was performed to assess early pregnancy.

[Series 1: us ob transvaginal · 0.08mm/px · 95 acquisitions, 13 frames shown]
[im 4/95]
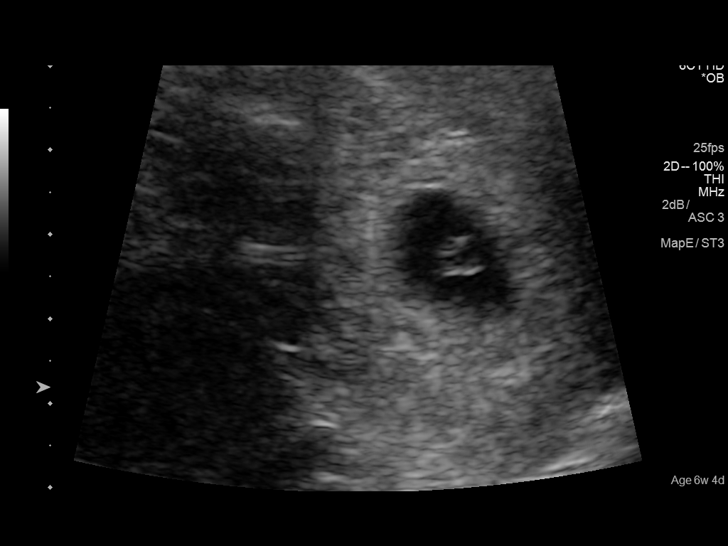
[im 11/95]
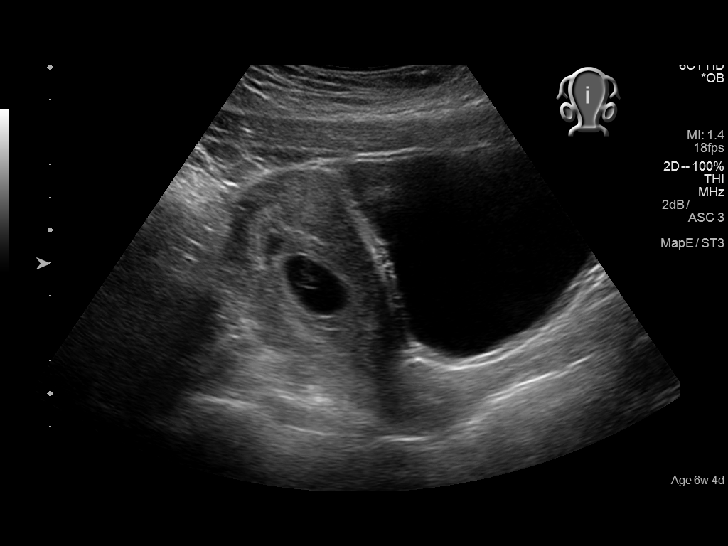
[im 18/95]
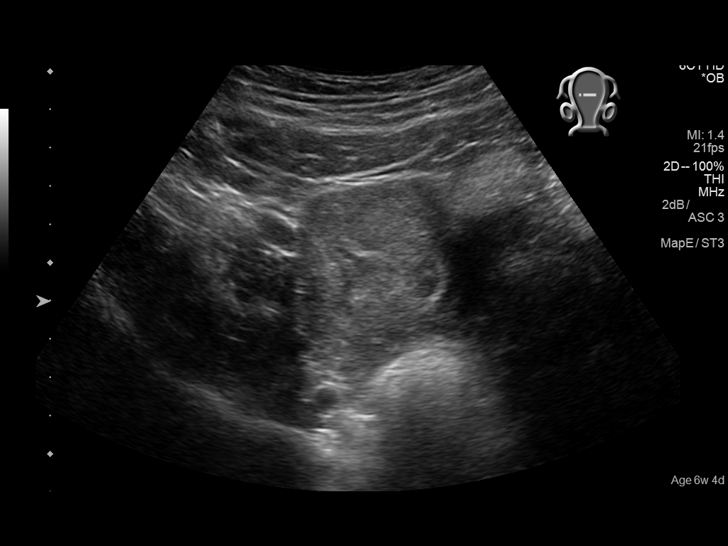
[im 25/95]
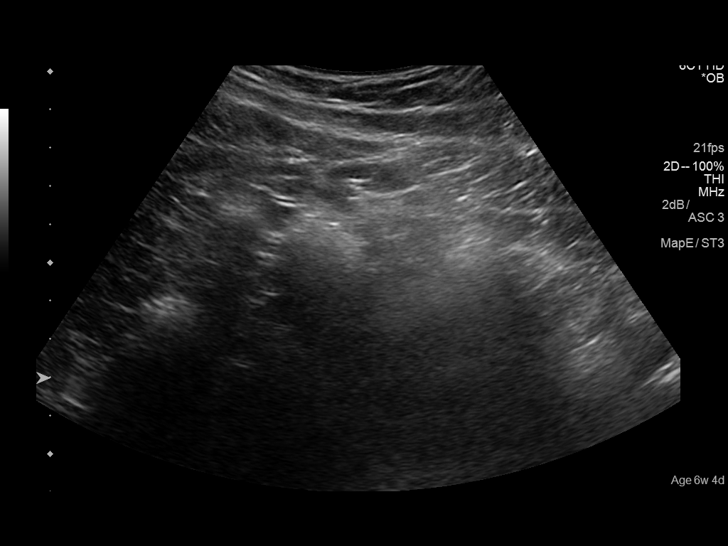
[im 32/95]
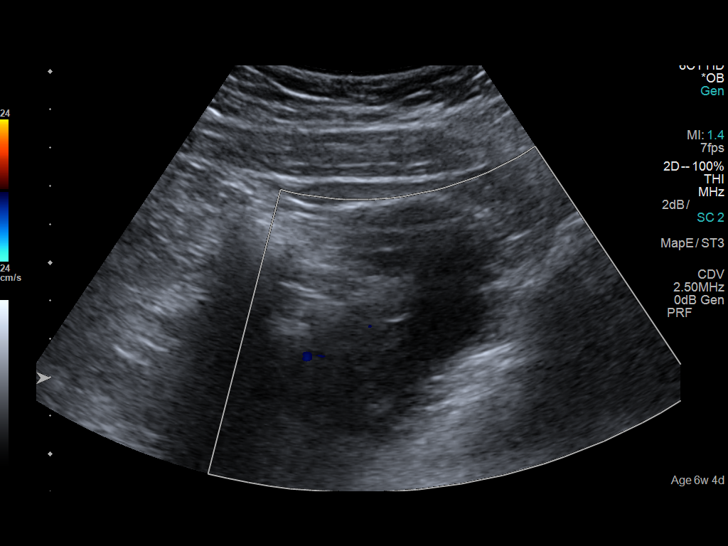
[im 39/95]
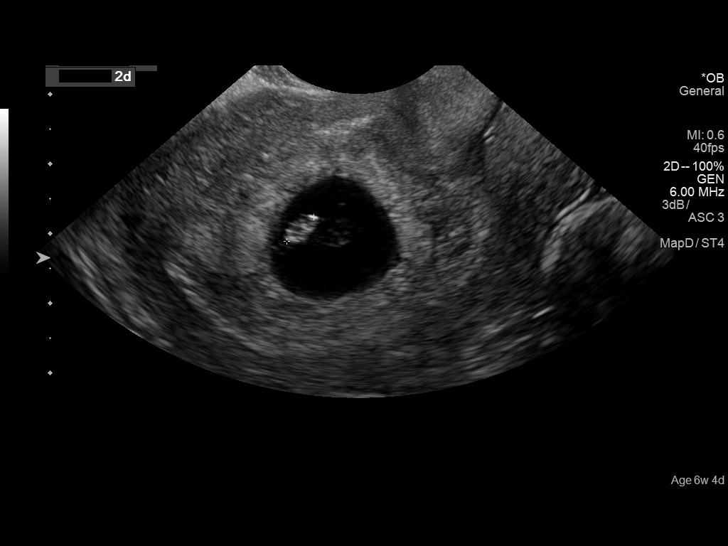
[im 49/95]
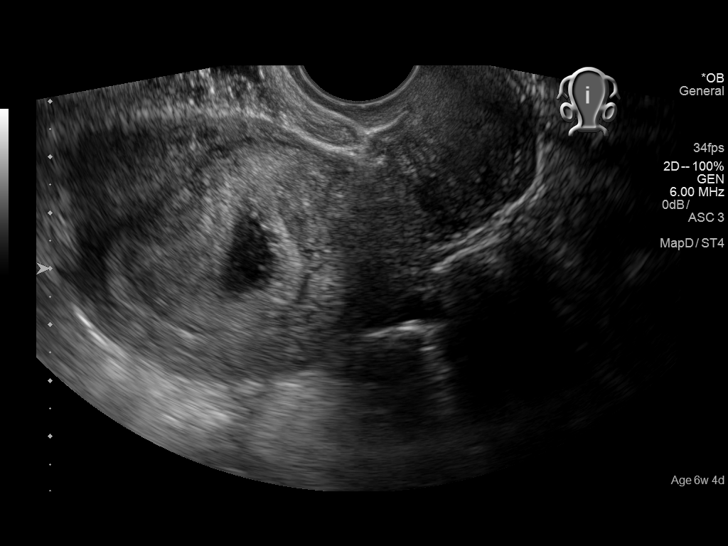
[im 56/95]
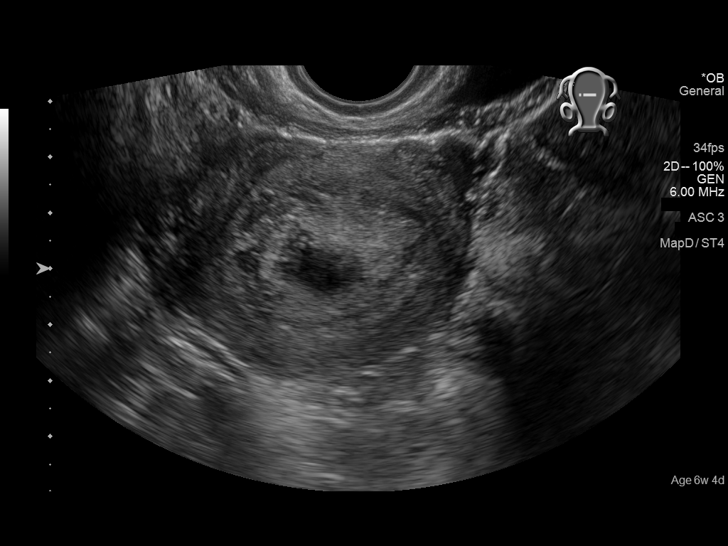
[im 63/95]
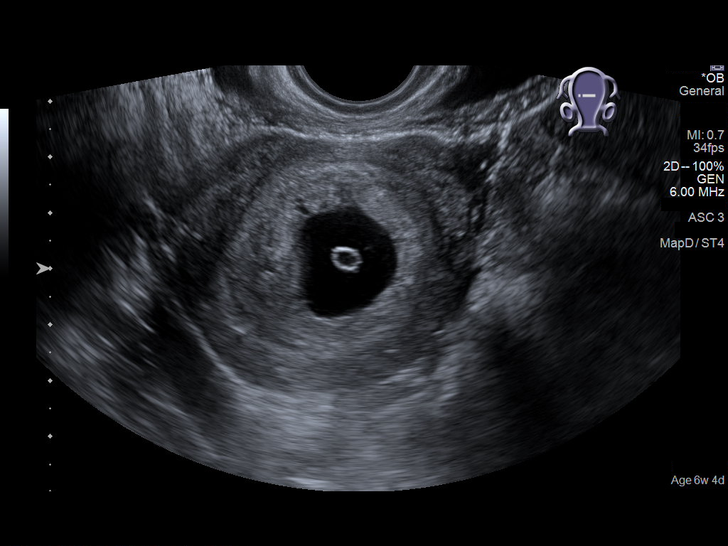
[im 70/95]
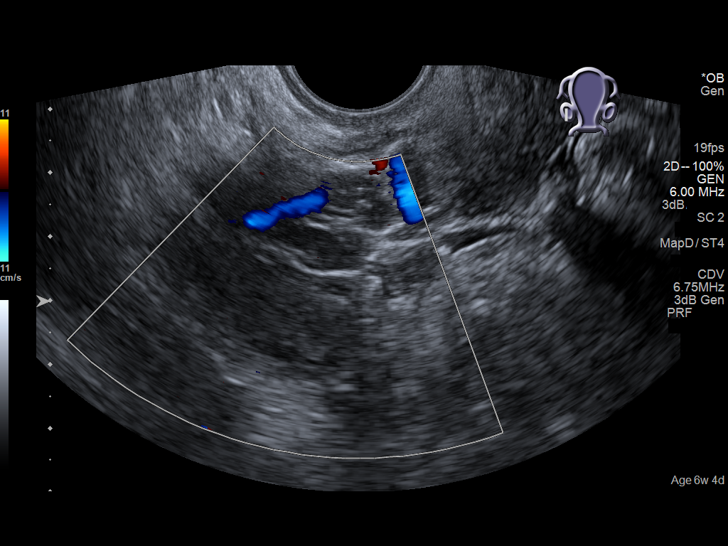
[im 77/95]
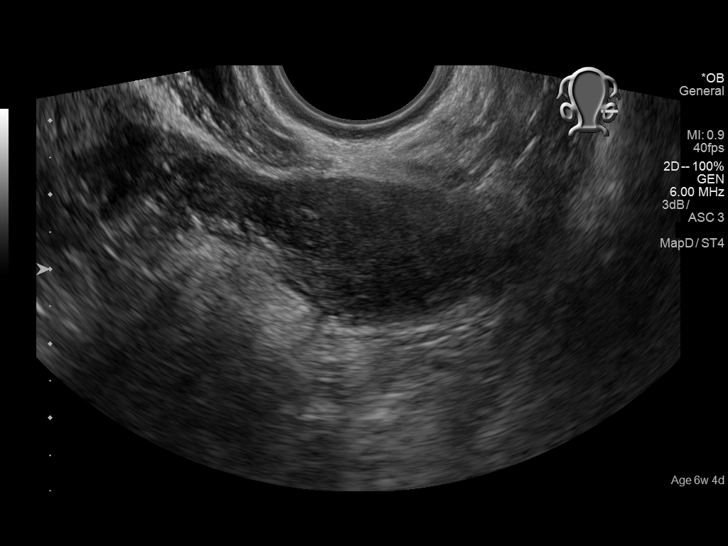
[im 84/95]
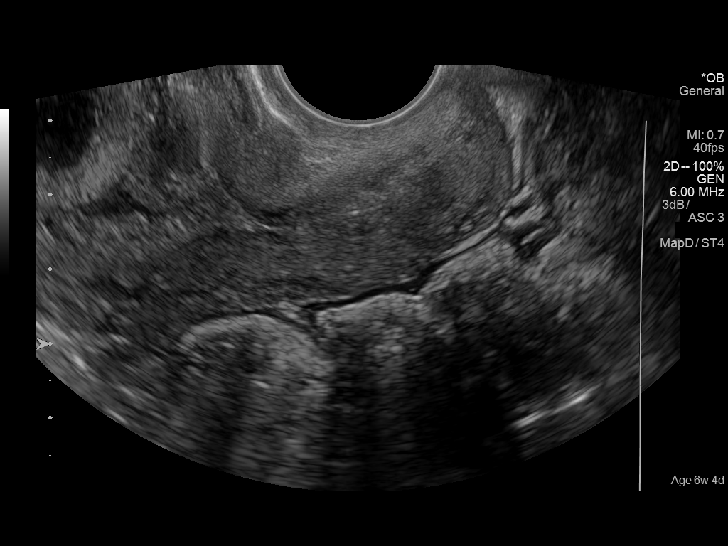
[im 91/95]
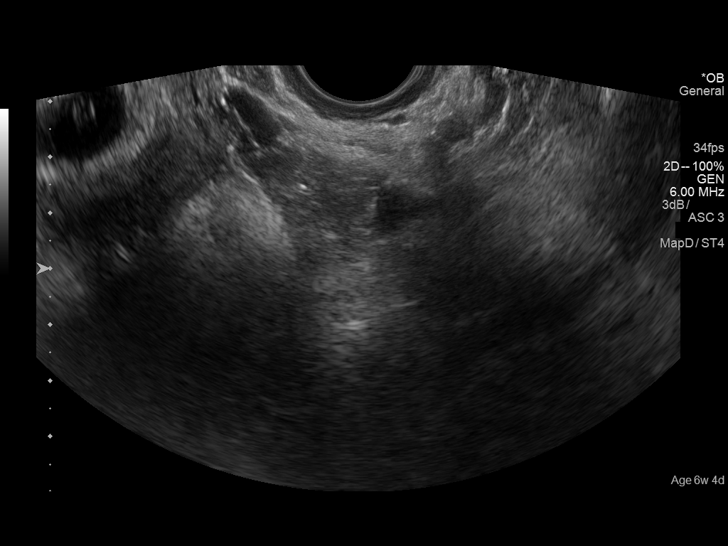

[13 of 28 positions shown; findings below may reference images not displayed]

FINDINGS: Intrauterine gestational sac: A single intrauterine gestational sac
is present.

Yolk sac:  Yolk sac is present.

Embryo:  Fetal pole is present.

Cardiac Activity: Fetal cardiac activity is observed.

Heart Rate: 123  bpm

CRL:  6  mm   6 w   2 d                  US EDC: 09/16/2016

Subchorionic hemorrhage:  None visualized.

Maternal uterus/adnexae: Uterus is anteverted. No myometrial mass
lesions are identified. Both ovaries are visualized and appear
normal. No abnormal adnexal masses. No free fluid in the pelvis.
IMPRESSION: Single intrauterine pregnancy. Estimated gestational age by
crown-rump length is 6 weeks 2 days. No acute complication
demonstrated sonographically.

## 2016-08-21 ENCOUNTER — Ambulatory Visit (INDEPENDENT_AMBULATORY_CARE_PROVIDER_SITE_OTHER): Payer: Medicaid Other | Admitting: Certified Nurse Midwife

## 2016-08-21 VITALS — BP 107/67 | HR 78 | Wt 218.0 lb

## 2016-08-21 DIAGNOSIS — O99013 Anemia complicating pregnancy, third trimester: Secondary | ICD-10-CM

## 2016-08-21 DIAGNOSIS — B951 Streptococcus, group B, as the cause of diseases classified elsewhere: Secondary | ICD-10-CM | POA: Insufficient documentation

## 2016-08-21 DIAGNOSIS — Z283 Underimmunization status: Secondary | ICD-10-CM

## 2016-08-21 DIAGNOSIS — Z3400 Encounter for supervision of normal first pregnancy, unspecified trimester: Secondary | ICD-10-CM

## 2016-08-21 DIAGNOSIS — O09899 Supervision of other high risk pregnancies, unspecified trimester: Secondary | ICD-10-CM

## 2016-08-21 DIAGNOSIS — D573 Sickle-cell trait: Secondary | ICD-10-CM

## 2016-08-21 NOTE — Progress Notes (Signed)
   PRENATAL VISIT NOTE  Subjective:  Tammie Mckee is a 21 y.o. G2P0010 at 6249w4d being seen today for ongoing prenatal care.  She is currently monitored for the following issues for this low-risk pregnancy and has Supervision of normal first pregnancy, antepartum; Sickle cell trait (HCC); Susceptible to varicella (non-immune), currently pregnant; and Positive GBS test on her problem list.  Patient reports no complaints.  Contractions: Irregular. Vag. Bleeding: None.  Movement: Present. Denies leaking of fluid.   The following portions of the patient's history were reviewed and updated as appropriate: allergies, current medications, past family history, past medical history, past social history, past surgical history and problem list. Problem list updated.  Objective:   Vitals:   08/21/16 1011  BP: 107/67  Pulse: 78  Weight: 218 lb (98.9 kg)    Fetal Status: Fetal Heart Rate (bpm): 152 Fundal Height: 37 cm Movement: Present     General:  Alert, oriented and cooperative. Patient is in no acute distress.  Skin: Skin is warm and dry. No rash noted.   Cardiovascular: Normal heart rate noted  Respiratory: Normal respiratory effort, no problems with respiration noted  Abdomen: Soft, gravid, appropriate for gestational age. Pain/Pressure: Present     Pelvic:  Cervical exam deferred        Extremities: Normal range of motion.  Edema: Trace  Mental Status: Normal mood and affect. Normal behavior. Normal judgment and thought content.   Assessment and Plan:  Pregnancy: G2P0010 at 7649w4d  1. Susceptible to varicella (non-immune), currently pregnant     Varicella non-immune  2. Supervision of normal first pregnancy, antepartum      Doing well  3. Sickle cell trait (HCC)      Last UC 3 weeks ago: normal  4. Positive GBS test     PCN during delivery.  Preterm labor symptoms and general obstetric precautions including but not limited to vaginal bleeding, contractions, leaking of fluid and  fetal movement were reviewed in detail with the patient. Please refer to After Visit Summary for other counseling recommendations.  Return in about 1 week (around 08/28/2016) for ROB.   Tammie Mckee, CNM

## 2016-08-29 ENCOUNTER — Ambulatory Visit (INDEPENDENT_AMBULATORY_CARE_PROVIDER_SITE_OTHER): Payer: Medicaid Other | Admitting: Certified Nurse Midwife

## 2016-08-29 ENCOUNTER — Other Ambulatory Visit (HOSPITAL_COMMUNITY)
Admission: RE | Admit: 2016-08-29 | Discharge: 2016-08-29 | Disposition: A | Discharge status: Home / Self Care | Payer: Medicaid Other | Source: Ambulatory Visit | Attending: Certified Nurse Midwife | Admitting: Certified Nurse Midwife

## 2016-08-29 VITALS — BP 102/71 | HR 87 | Wt 219.0 lb

## 2016-08-29 DIAGNOSIS — Z113 Encounter for screening for infections with a predominantly sexual mode of transmission: Secondary | ICD-10-CM | POA: Diagnosis not present

## 2016-08-29 DIAGNOSIS — D573 Sickle-cell trait: Secondary | ICD-10-CM

## 2016-08-29 DIAGNOSIS — O99013 Anemia complicating pregnancy, third trimester: Secondary | ICD-10-CM

## 2016-08-29 DIAGNOSIS — Z283 Underimmunization status: Secondary | ICD-10-CM

## 2016-08-29 DIAGNOSIS — Z3400 Encounter for supervision of normal first pregnancy, unspecified trimester: Secondary | ICD-10-CM

## 2016-08-29 DIAGNOSIS — O09899 Supervision of other high risk pregnancies, unspecified trimester: Secondary | ICD-10-CM

## 2016-08-29 DIAGNOSIS — B951 Streptococcus, group B, as the cause of diseases classified elsewhere: Secondary | ICD-10-CM

## 2016-08-29 NOTE — Progress Notes (Signed)
   PRENATAL VISIT NOTE  Subjective:  Tammie Mckee is a 21 y.o. G2P0010 at 7063w5d being seen today for ongoing prenatal care.  She is currently monitored for the following issues for this low-risk pregnancy and has Supervision of normal first pregnancy, antepartum; Sickle cell trait (HCC); Susceptible to varicella (non-immune), currently pregnant; and Positive GBS test on her problem list.  Patient reports no complaints.  Contractions: Irregular. Vag. Bleeding: None.  Movement: Present. Denies leaking of fluid.   The following portions of the patient's history were reviewed and updated as appropriate: allergies, current medications, past family history, past medical history, past social history, past surgical history and problem list. Problem list updated.  Objective:   Vitals:   08/29/16 0958  BP: 102/71  Pulse: 87  Weight: 219 lb (99.3 kg)    Fetal Status: Fetal Heart Rate (bpm): 152 Fundal Height: 37 cm Movement: Present  Presentation: Vertex  General:  Alert, oriented and cooperative. Patient is in no acute distress.  Skin: Skin is warm and dry. No rash noted.   Cardiovascular: Normal heart rate noted  Respiratory: Normal respiratory effort, no problems with respiration noted  Abdomen: Soft, gravid, appropriate for gestational age. Pain/Pressure: Present     Pelvic:  Cervical exam performed Dilation: 1 Effacement (%): 20 Station: -3  Extremities: Normal range of motion.  Edema: Trace  Mental Status: Normal mood and affect. Normal behavior. Normal judgment and thought content.   Assessment and Plan:  Pregnancy: G2P0010 at 3763w5d  1. Supervision of normal first pregnancy, antepartum     Doing well.  Does have lumbar back pain.  Would like to stop working next week.   - Cervicovaginal ancillary only  2. Susceptible to varicella (non-immune), currently pregnant     Varicella postpartum  3. Sickle cell trait (HCC)     UA 08/07/16: normal  4. Positive GBS test     PCN for  labor  Term labor symptoms and general obstetric precautions including but not limited to vaginal bleeding, contractions, leaking of fluid and fetal movement were reviewed in detail with the patient. Please refer to After Visit Summary for other counseling recommendations.  Return in about 1 week (around 09/05/2016) for ROB.   Roe Coombsachelle A Ladamien Rammel, CNM

## 2016-09-01 LAB — CERVICOVAGINAL ANCILLARY ONLY
Chlamydia: NEGATIVE
Neisseria Gonorrhea: NEGATIVE

## 2016-09-04 ENCOUNTER — Ambulatory Visit (INDEPENDENT_AMBULATORY_CARE_PROVIDER_SITE_OTHER): Payer: Medicaid Other | Admitting: Certified Nurse Midwife

## 2016-09-04 VITALS — BP 111/68 | HR 91 | Wt 220.0 lb

## 2016-09-04 DIAGNOSIS — O99013 Anemia complicating pregnancy, third trimester: Secondary | ICD-10-CM

## 2016-09-04 DIAGNOSIS — D573 Sickle-cell trait: Secondary | ICD-10-CM

## 2016-09-04 DIAGNOSIS — Z283 Underimmunization status: Secondary | ICD-10-CM

## 2016-09-04 DIAGNOSIS — Z2839 Other underimmunization status: Secondary | ICD-10-CM

## 2016-09-04 DIAGNOSIS — Z3400 Encounter for supervision of normal first pregnancy, unspecified trimester: Secondary | ICD-10-CM

## 2016-09-04 DIAGNOSIS — B951 Streptococcus, group B, as the cause of diseases classified elsewhere: Secondary | ICD-10-CM

## 2016-09-04 DIAGNOSIS — O09899 Supervision of other high risk pregnancies, unspecified trimester: Secondary | ICD-10-CM

## 2016-09-04 NOTE — Progress Notes (Signed)
Pt would like cervical exam today. 

## 2016-09-04 NOTE — Progress Notes (Signed)
   PRENATAL VISIT NOTE  Subjective:  Holley DexterChezney Garay is a 21 y.o. G2P0010 at 4725w4d being seen today for ongoing prenatal care.  She is currently monitored for the following issues for this low-risk pregnancy and has Supervision of normal first pregnancy, antepartum; Sickle cell trait (HCC); Susceptible to varicella (non-immune), currently pregnant; and Positive GBS test on her problem list.  Patient reports no complaints.  Contractions: Irregular. Vag. Bleeding: None.  Movement: Present. Denies leaking of fluid.   The following portions of the patient's history were reviewed and updated as appropriate: allergies, current medications, past family history, past medical history, past social history, past surgical history and problem list. Problem list updated.  Objective:   Vitals:   09/04/16 0946  BP: 111/68  Pulse: 91  Weight: 220 lb (99.8 kg)    Fetal Status: Fetal Heart Rate (bpm): 150 Fundal Height: 39 cm Movement: Present  Presentation: Vertex  General:  Alert, oriented and cooperative. Patient is in no acute distress.  Skin: Skin is warm and dry. No rash noted.   Cardiovascular: Normal heart rate noted  Respiratory: Normal respiratory effort, no problems with respiration noted  Abdomen: Soft, gravid, appropriate for gestational age. Pain/Pressure: Present     Pelvic:  Cervical exam performed Dilation: 1.5 Effacement (%): 30 Station: -3  Extremities: Normal range of motion.     Mental Status: Normal mood and affect. Normal behavior. Normal judgment and thought content.   Assessment and Plan:  Pregnancy: G2P0010 at 4425w4d  1. Susceptible to varicella (non-immune), currently pregnant     Varicella postpartum  2. Supervision of normal first pregnancy, antepartum      Doing well.  Desires to stop working tomorrow.  Letter completed.    3. Sickle cell trait (HCC)       4. Positive GBS test     PCN for labor.   Term labor symptoms and general obstetric precautions including  but not limited to vaginal bleeding, contractions, leaking of fluid and fetal movement were reviewed in detail with the patient. Please refer to After Visit Summary for other counseling recommendations.  Return in about 1 week (around 09/11/2016) for ROB.   Roe Coombsachelle A Denney, CNM

## 2016-09-07 ENCOUNTER — Inpatient Hospital Stay (HOSPITAL_COMMUNITY)
Admission: AD | Admit: 2016-09-07 | Discharge: 2016-09-08 | Disposition: A | Discharge status: Home / Self Care | Payer: Medicaid Other | Source: Ambulatory Visit | Attending: Obstetrics and Gynecology | Admitting: Obstetrics and Gynecology

## 2016-09-07 ENCOUNTER — Encounter (HOSPITAL_COMMUNITY): Payer: Self-pay

## 2016-09-07 DIAGNOSIS — O09899 Supervision of other high risk pregnancies, unspecified trimester: Secondary | ICD-10-CM

## 2016-09-07 DIAGNOSIS — Z283 Underimmunization status: Secondary | ICD-10-CM

## 2016-09-07 DIAGNOSIS — O479 False labor, unspecified: Secondary | ICD-10-CM

## 2016-09-07 DIAGNOSIS — Z3400 Encounter for supervision of normal first pregnancy, unspecified trimester: Secondary | ICD-10-CM

## 2016-09-07 NOTE — MAU Note (Signed)
Pt presents complaining of contractions every 4 minutes all day. Denies vaginal bleeding or leaking of fluid. Reports good fetal movement. 1.5cm in office this week.

## 2016-09-07 NOTE — MAU Note (Signed)
Urine sent to lab 

## 2016-09-08 ENCOUNTER — Inpatient Hospital Stay (HOSPITAL_COMMUNITY): Payer: Medicaid Other | Admitting: Anesthesiology

## 2016-09-08 ENCOUNTER — Inpatient Hospital Stay (HOSPITAL_COMMUNITY)
Admission: AD | Admit: 2016-09-08 | Discharge: 2016-09-10 | DRG: 775 | Disposition: A | Discharge status: Home / Self Care | Payer: Medicaid Other | Source: Ambulatory Visit | Attending: Obstetrics and Gynecology | Admitting: Obstetrics and Gynecology

## 2016-09-08 ENCOUNTER — Encounter (HOSPITAL_COMMUNITY): Payer: Self-pay | Admitting: *Deleted

## 2016-09-08 DIAGNOSIS — Z833 Family history of diabetes mellitus: Secondary | ICD-10-CM

## 2016-09-08 DIAGNOSIS — Z3A39 39 weeks gestation of pregnancy: Secondary | ICD-10-CM

## 2016-09-08 DIAGNOSIS — O99824 Streptococcus B carrier state complicating childbirth: Secondary | ICD-10-CM | POA: Diagnosis not present

## 2016-09-08 DIAGNOSIS — Z283 Underimmunization status: Secondary | ICD-10-CM

## 2016-09-08 DIAGNOSIS — D573 Sickle-cell trait: Secondary | ICD-10-CM | POA: Diagnosis present

## 2016-09-08 DIAGNOSIS — O9902 Anemia complicating childbirth: Secondary | ICD-10-CM | POA: Diagnosis present

## 2016-09-08 DIAGNOSIS — Z3400 Encounter for supervision of normal first pregnancy, unspecified trimester: Secondary | ICD-10-CM

## 2016-09-08 DIAGNOSIS — Z8249 Family history of ischemic heart disease and other diseases of the circulatory system: Secondary | ICD-10-CM | POA: Diagnosis not present

## 2016-09-08 DIAGNOSIS — Z3493 Encounter for supervision of normal pregnancy, unspecified, third trimester: Secondary | ICD-10-CM | POA: Diagnosis present

## 2016-09-08 DIAGNOSIS — O09899 Supervision of other high risk pregnancies, unspecified trimester: Secondary | ICD-10-CM

## 2016-09-08 LAB — TYPE AND SCREEN
ABO/RH(D): A POS
Antibody Screen: NEGATIVE

## 2016-09-08 LAB — CBC
HEMATOCRIT: 33.1 % — AB (ref 36.0–46.0)
HEMOGLOBIN: 12.1 g/dL (ref 12.0–15.0)
MCH: 30 pg (ref 26.0–34.0)
MCHC: 36.6 g/dL — AB (ref 30.0–36.0)
MCV: 82.1 fL (ref 78.0–100.0)
Platelets: 215 10*3/uL (ref 150–400)
RBC: 4.03 MIL/uL (ref 3.87–5.11)
RDW: 13.7 % (ref 11.5–15.5)
WBC: 13.9 10*3/uL — ABNORMAL HIGH (ref 4.0–10.5)

## 2016-09-08 LAB — ABO/RH: ABO/RH(D): A POS

## 2016-09-08 MED ORDER — PENICILLIN G POT IN DEXTROSE 60000 UNIT/ML IV SOLN
3.0000 10*6.[IU] | INTRAVENOUS | Status: DC
  Administered 2016-09-08 (×2): 3 10*6.[IU] via INTRAVENOUS
  Filled 2016-09-08 (×8): qty 50

## 2016-09-08 MED ORDER — ZOLPIDEM TARTRATE 5 MG PO TABS: 5.0000 mg | ORAL_TABLET | Freq: Every evening | ORAL | Status: DC | PRN

## 2016-09-08 MED ORDER — SOD CITRATE-CITRIC ACID 500-334 MG/5ML PO SOLN: 30.0000 mL | ORAL | Status: DC | PRN

## 2016-09-08 MED ORDER — OXYTOCIN BOLUS FROM INFUSION
500.0000 mL | Freq: Once | INTRAVENOUS | Status: AC
  Administered 2016-09-08: 500 mL via INTRAVENOUS

## 2016-09-08 MED ORDER — DIPHENHYDRAMINE HCL 25 MG PO CAPS: 25.0000 mg | ORAL_CAPSULE | Freq: Four times a day (QID) | ORAL | Status: DC | PRN

## 2016-09-08 MED ORDER — ONDANSETRON HCL 4 MG PO TABS
4.0000 mg | ORAL_TABLET | ORAL | Status: DC | PRN
Start: 2016-09-08 — End: 2016-09-10

## 2016-09-08 MED ORDER — WITCH HAZEL-GLYCERIN EX PADS: 1.0000 "application " | MEDICATED_PAD | CUTANEOUS | Status: DC | PRN

## 2016-09-08 MED ORDER — LIDOCAINE HCL (PF) 1 % IJ SOLN
INTRAMUSCULAR | Status: DC | PRN
  Administered 2016-09-08 (×2): 7 mL via EPIDURAL

## 2016-09-08 MED ORDER — EPHEDRINE 5 MG/ML INJ
10.0000 mg | INTRAVENOUS | Status: DC | PRN
  Filled 2016-09-08: qty 4

## 2016-09-08 MED ORDER — EPHEDRINE 5 MG/ML INJ
10.0000 mg | INTRAVENOUS | Status: DC | PRN
Start: 2016-09-08 — End: 2016-09-08
  Filled 2016-09-08 (×2): qty 4

## 2016-09-08 MED ORDER — ONDANSETRON HCL 4 MG/2ML IJ SOLN
4.0000 mg | Freq: Four times a day (QID) | INTRAMUSCULAR | Status: DC | PRN
  Administered 2016-09-08: 4 mg via INTRAVENOUS
  Filled 2016-09-08: qty 2

## 2016-09-08 MED ORDER — ONDANSETRON HCL 4 MG/2ML IJ SOLN: 4.0000 mg | INTRAMUSCULAR | Status: DC | PRN

## 2016-09-08 MED ORDER — IBUPROFEN 600 MG PO TABS
600.0000 mg | ORAL_TABLET | Freq: Four times a day (QID) | ORAL | Status: DC
  Administered 2016-09-08 – 2016-09-10 (×7): 600 mg via ORAL
  Filled 2016-09-08 (×7): qty 1

## 2016-09-08 MED ORDER — FENTANYL CITRATE (PF) 100 MCG/2ML IJ SOLN
50.0000 ug | INTRAMUSCULAR | Status: DC | PRN
  Administered 2016-09-08: 100 ug via INTRAVENOUS

## 2016-09-08 MED ORDER — LIDOCAINE HCL (PF) 1 % IJ SOLN
30.0000 mL | INTRAMUSCULAR | Status: DC | PRN
  Administered 2016-09-08: 30 mL via SUBCUTANEOUS
  Filled 2016-09-08: qty 30

## 2016-09-08 MED ORDER — PHENYLEPHRINE 40 MCG/ML (10ML) SYRINGE FOR IV PUSH (FOR BLOOD PRESSURE SUPPORT)
80.0000 ug | PREFILLED_SYRINGE | INTRAVENOUS | Status: DC | PRN
Start: 2016-09-08 — End: 2016-09-08
  Administered 2016-09-08: 80 ug via INTRAVENOUS
  Filled 2016-09-08: qty 5

## 2016-09-08 MED ORDER — OXYCODONE-ACETAMINOPHEN 5-325 MG PO TABS: 2.0000 | ORAL_TABLET | ORAL | Status: DC | PRN

## 2016-09-08 MED ORDER — FENTANYL 2.5 MCG/ML BUPIVACAINE 1/10 % EPIDURAL INFUSION (WH - ANES)
14.0000 mL/h | INTRAMUSCULAR | Status: DC | PRN
  Administered 2016-09-08 (×2): 14 mL/h via EPIDURAL
  Filled 2016-09-08: qty 100

## 2016-09-08 MED ORDER — ACETAMINOPHEN 325 MG PO TABS: 650.0000 mg | ORAL_TABLET | ORAL | Status: DC | PRN

## 2016-09-08 MED ORDER — OXYCODONE-ACETAMINOPHEN 5-325 MG PO TABS: 1.0000 | ORAL_TABLET | ORAL | Status: DC | PRN

## 2016-09-08 MED ORDER — LACTATED RINGERS IV SOLN
500.0000 mL | INTRAVENOUS | Status: DC | PRN
  Administered 2016-09-08: 450 mL via INTRAVENOUS

## 2016-09-08 MED ORDER — SENNOSIDES-DOCUSATE SODIUM 8.6-50 MG PO TABS
2.0000 | ORAL_TABLET | ORAL | Status: DC
  Administered 2016-09-08 – 2016-09-09 (×2): 2 via ORAL
  Filled 2016-09-08 (×2): qty 2

## 2016-09-08 MED ORDER — PENICILLIN G POTASSIUM 5000000 UNITS IJ SOLR
5.0000 10*6.[IU] | Freq: Once | INTRAVENOUS | Status: AC
  Administered 2016-09-08: 5 10*6.[IU] via INTRAVENOUS
  Filled 2016-09-08: qty 5

## 2016-09-08 MED ORDER — PRENATAL MULTIVITAMIN CH
1.0000 | ORAL_TABLET | Freq: Every day | ORAL | Status: DC
  Administered 2016-09-09: 1 via ORAL
  Filled 2016-09-08: qty 1

## 2016-09-08 MED ORDER — BENZOCAINE-MENTHOL 20-0.5 % EX AERO
1.0000 "application " | INHALATION_SPRAY | CUTANEOUS | Status: DC | PRN
  Administered 2016-09-10: 1 via TOPICAL
  Filled 2016-09-08 (×2): qty 56

## 2016-09-08 MED ORDER — OXYTOCIN 40 UNITS IN LACTATED RINGERS INFUSION - SIMPLE MED
2.5000 [IU]/h | INTRAVENOUS | Status: DC
  Filled 2016-09-08: qty 1000

## 2016-09-08 MED ORDER — DIPHENHYDRAMINE HCL 50 MG/ML IJ SOLN: 12.5000 mg | INTRAMUSCULAR | Status: DC | PRN

## 2016-09-08 MED ORDER — TETANUS-DIPHTH-ACELL PERTUSSIS 5-2.5-18.5 LF-MCG/0.5 IM SUSP: 0.5000 mL | Freq: Once | INTRAMUSCULAR | Status: DC

## 2016-09-08 MED ORDER — FENTANYL CITRATE (PF) 100 MCG/2ML IJ SOLN
INTRAMUSCULAR | Status: AC
  Filled 2016-09-08: qty 2

## 2016-09-08 MED ORDER — LACTATED RINGERS IV SOLN
INTRAVENOUS | Status: DC
  Administered 2016-09-08 (×2): via INTRAVENOUS

## 2016-09-08 MED ORDER — FLEET ENEMA 7-19 GM/118ML RE ENEM: 1.0000 | ENEMA | RECTAL | Status: DC | PRN

## 2016-09-08 MED ORDER — PHENYLEPHRINE 40 MCG/ML (10ML) SYRINGE FOR IV PUSH (FOR BLOOD PRESSURE SUPPORT)
80.0000 ug | PREFILLED_SYRINGE | INTRAVENOUS | Status: AC | PRN
  Administered 2016-09-08 (×3): 80 ug via INTRAVENOUS
  Filled 2016-09-08: qty 10

## 2016-09-08 MED ORDER — LACTATED RINGERS IV SOLN: 500.0000 mL | Freq: Once | INTRAVENOUS | Status: DC

## 2016-09-08 MED ORDER — DIBUCAINE 1 % RE OINT: 1.0000 "application " | TOPICAL_OINTMENT | RECTAL | Status: DC | PRN

## 2016-09-08 MED ORDER — COCONUT OIL OIL
1.0000 "application " | TOPICAL_OIL | Status: DC | PRN
  Filled 2016-09-08: qty 120

## 2016-09-08 MED ORDER — SIMETHICONE 80 MG PO CHEW: 80.0000 mg | CHEWABLE_TABLET | ORAL | Status: DC | PRN

## 2016-09-08 NOTE — Anesthesia Pain Management Evaluation Note (Signed)
  CRNA Pain Management Visit Note  Patient: Tammie Mckee, 21 y.o., female  "Hello I am a [facility] of the anesthesia team at [facility]. We have an anesthesia team available at all times to provide care throughout the hospital, including epidural management and anesthesia for C-section. I don't know your plan for the delivery whether it a natural birth, water birth, IV sedation, nitrous supplementation, doula or epidural, but we want to meet your pain goals."   1.Was your pain managed to your expectations on prior hospitalizations?   No prior hospitalizations  2.What is your expectation for pain management during this hospitalization?     Epidural  3.How can we help you reach that goal? epidural  Record the patient's initial score and the patient's pain goal.   Pain: 3  Pain Goal: 4 [facility] wants you to be able to say your pain was always managed very well.  Madison HickmanGREGORY,Joffrey Kerce 09/08/2016

## 2016-09-08 NOTE — H&P (Signed)
LABOR ADMISSION HISTORY AND PHYSICAL  Tammie DexterChezney Mckee is a 21 y.o. female G2P0010 with IUP at 5848w1d by LMP c/w 5 wk sono presenting for SOL. Cervix was 1.5/30/-3 when last checked three days ago. Patient reports ctx every 3 minutes and reports they are getting closer together/stronger. She reports +FMs, No LOF, no VB, no blurry vision, headaches or peripheral edema, and RUQ pain.  She plans on breast feeding. She request NuvaRing for birth control.  Dating: By LMP c/w 5 wk sono --->  Estimated Date of Delivery: 09/14/16  Prenatal History/Complications: Varicella Non-Immune Sickle Cell Trait   Past Medical History: Past Medical History:  Diagnosis Date  . Sickle cell trait [facility](HCC)     Past Surgical History: Past Surgical History:  Procedure Laterality Date  . KNEE SURGERY      Obstetrical History: OB History    Gravida Para Term Preterm AB Living   2 0 0 0 1 0   SAB TAB Ectopic Multiple Live Births   1 0 0 0 0      Social History: Social History   Social History  . Marital status: Single    Spouse name: N/A  . Number of children: N/A  . Years of education: N/A   Social History Main Topics  . Smoking status: Never Smoker  . Smokeless tobacco: Never Used  . Alcohol use No     Comment: Occasional  . Drug use: No  . Sexual activity: Yes    Birth control/ protection: None   Other Topics Concern  . None   Social History Narrative  . None    Family History: Family History  Problem Relation Age of Onset  . Diabetes Father   . Hypertension Father     Allergies: No Known Allergies  Prescriptions Prior to Admission  Medication Sig Dispense Refill Last Dose  . Prenatal Vit-Fe Fumarate-FA (PRENATAL MULTIVITAMIN) TABS tablet Take 1 tablet by mouth at bedtime.    Not Taking  . sodium chloride (OCEAN) 0.65 % SOLN nasal spray Place 1 spray into both nostrils as needed for congestion.   Not Taking     Review of Systems   All systems reviewed and negative except as  stated in HPI  Last menstrual period 12/09/2015. General appearance: alert and cooperative, breathing through ctx and uncomfortable  Lungs: clear to auscultation bilaterally Heart: regular rate and rhythm Abdomen: soft, non-tender; bowel sounds normal Extremities: Homans sign is negative, no sign of DVT Presentation: cephalic Fetal monitoringBaseline: 135 bpm, Variability: Good {> 6 bpm), Accelerations: Reactive and Decelerations: Absent Uterine activityFrequency: Every 3-4 minutes and Intensity: mild Dilation: 4 Effacement (%): 90 Station: -1 Exam by:: [email or web address], rnc    Prenatal labs: ABO, Rh: A/Positive/-- (08/07 1104) Antibody: Negative (08/07 1104) Rubella: Immune RPR: Non Reactive (12/14 1040)  HBsAg: Negative (08/07 1104)  HIV: Non Reactive (12/14 1040)  GBS: Positive (02/01 0950)  2 hr Glucola 77/105/78 Genetic screening  Declined Anatomy US Normal   Clinic  [facility] Prenatal Labs  Dating  LMP c/w 5 wk sono Blood type: A/Positive/-- (08/07 1104) A pos  Genetic Screen Quad: undecided- offer post anatomy    NIPS: Antibody:Negative (08/07 1104)neg  Anatomic US  Normal Rubella: 1.91 (08/07 1104)Immune  GTT Third trimester: nl 2 hr RPR: Non Reactive (12/14 1040) NR  Flu vaccine 05-29-16 HBsAg: Negative (08/07 1104) Neg  TDaP vaccine    06/26/16  Rhogam:n/a A+ HIV: Non Reactive (12/14 1040) NR  Baby Food  breast                                       [id number] (02/01 0950)  Contraception  NuvaRing Pap: Negative (02/18/2016)  Circumcision yes   Pediatrician    [facility]   Support Person mom      Prenatal Transfer Tool  Maternal Diabetes: No Genetic Screening: Declined Maternal Ultrasounds/Referrals: Normal Fetal Ultrasounds or other Referrals:  None Maternal Substance Abuse:  No Significant Maternal Medications:  None Significant Maternal Lab Results: Lab values include: Group B Strep positive  No results found  for this or any previous visit (from the past 24 hour(s)).  Patient Active Problem List   Diagnosis Date Noted  . Positive GBS test 08/21/2016  . Susceptible to varicella (non-immune), currently pregnant 02/26/2016  . Sickle cell trait (HCC) 02/19/2016  . Supervision of normal first pregnancy, antepartum 02/18/2016    Assessment: Tammie Mckee is a 21 y.o. G2P0010 at [redacted]w[redacted]d here for SOL.   #Labor:Expectant management #Pain: Considering Epidural  #FWB: Cat I  #ID:  GBS+, prophylaxis with PCN  #MOF: breast  #MOC:Nuva Ring  #Circ:  Outpatient   De Hollingshead 09/08/2016, 5:07 AM

## 2016-09-08 NOTE — Anesthesia Procedure Notes (Signed)
Epidural Patient location during procedure: OB Start time: 09/08/2016 6:27 AM End time: 09/08/2016 6:33 AM  Staffing Anesthesiologist: Leilani AbleHATCHETT, Jeanice Dempsey Performed: anesthesiologist   Preanesthetic Checklist Completed: patient identified, surgical consent, pre-op evaluation, timeout performed, IV checked, risks and benefits discussed and monitors and equipment checked  Epidural Patient position: sitting Prep: site prepped and draped and DuraPrep Patient monitoring: continuous pulse ox and blood pressure Approach: midline Location: L3-L4 Injection technique: LOR air  Needle:  Needle type: Tuohy  Needle gauge: 17 G Needle length: 9 cm and 9 Needle insertion depth: 7 cm Catheter type: closed end flexible Catheter size: 19 Gauge Catheter at skin depth: 12 cm Test dose: negative and Other  Assessment Sensory level: T9 Events: blood not aspirated, injection not painful, no injection resistance, negative IV test and no paresthesia  Additional Notes Reason for block:procedure for pain

## 2016-09-08 NOTE — Anesthesia Postprocedure Evaluation (Addendum)
Anesthesia Post Note  Patient: Tammie Mckee  Procedure(s) Performed: * No procedures listed *  Patient location during evaluation: Mother Baby Anesthesia Type: Epidural Level of consciousness: awake and alert Pain management: pain level controlled Vital Signs Assessment: post-procedure vital signs reviewed and stable Respiratory status: spontaneous breathing, nonlabored ventilation and respiratory function stable Cardiovascular status: stable Postop Assessment: no headache, no backache and epidural receding Anesthetic complications: no        Last Vitals:  Vitals:   09/08/16 1722 09/08/16 1819  BP: 94/65 112/63  Pulse: (!) 106 86  Resp: 18 18  Temp: 36.7 C 36.4 C    Last Pain:  Vitals:   09/08/16 1829  TempSrc:   PainSc: 0-No pain   Pain Goal:                 EchoStarMERRITT,DEBRA

## 2016-09-08 NOTE — MAU Note (Signed)
contractions 

## 2016-09-08 NOTE — MAU Note (Signed)
I have communicated with Dr. Earlene PlaterWallace and reviewed vital signs:  Vitals:   09/07/16 2245  BP: 132/77  Pulse: 73  Resp: 18  Temp: 97.7 F (36.5 C)    Vaginal exam:  Dilation: 3.5 Effacement (%): 50 Cervical Position: Middle Station: -2 Presentation: Vertex Exam by:: Kalman JewelsA Hayes, RN,   Also reviewed contraction pattern and that non-stress test is reactive.  It has been documented that patient is contracting every 5-6 minutes with no cervical change over 1.5 hours not indicating active labor.  Patient denies any other complaints.  Based on this report provider has given order for discharge.  A discharge diagnosis will be entered by a provider.  Labor discharge instructions reviewed with patient.

## 2016-09-08 NOTE — Discharge Instructions (Signed)
Braxton Hicks Contractions °Contractions of the uterus can occur throughout pregnancy. Contractions are not always a sign that you are in labor.  °WHAT ARE BRAXTON HICKS CONTRACTIONS?  °Contractions that occur before labor are called Braxton Hicks contractions, or false labor. Toward the end of pregnancy (32-34 weeks), these contractions can develop more often and may become more forceful. This is not true labor because these contractions do not result in opening (dilatation) and thinning of the cervix. They are sometimes difficult to tell apart from true labor because these contractions can be forceful and people have different pain tolerances. You should not feel embarrassed if you go to the hospital with false labor. Sometimes, the only way to tell if you are in true labor is for your health care provider to look for changes in the cervix. °If there are no prenatal problems or other health problems associated with the pregnancy, it is completely safe to be sent home with false labor and await the onset of true labor. °HOW CAN YOU TELL THE DIFFERENCE BETWEEN TRUE AND FALSE LABOR? °False Labor  °· The contractions of false labor are usually shorter and not as hard as those of true labor.   °· The contractions are usually irregular.   °· The contractions are often felt in the front of the lower abdomen and in the groin.   °· The contractions may go away when you walk around or change positions while lying down.   °· The contractions get weaker and are shorter lasting as time goes on.   °· The contractions do not usually become progressively stronger, regular, and closer together as with true labor.   °True Labor  °· Contractions in true labor last 30-70 seconds, become very regular, usually become more intense, and increase in frequency.   °· The contractions do not go away with walking.   °· The discomfort is usually felt in the top of the uterus and spreads to the lower abdomen and low back.   °· True labor can be  determined by your health care provider with an exam. This will show that the cervix is dilating and getting thinner.   °WHAT TO REMEMBER °· Keep up with your usual exercises and follow other instructions given by your health care provider.   °· Take medicines as directed by your health care provider.   °· Keep your regular prenatal appointments.   °· Eat and drink lightly if you think you are going into labor.   °· If Braxton Hicks contractions are making you uncomfortable:   °¨ Change your position from lying down or resting to walking, or from walking to resting.   °¨ Sit and rest in a tub of warm water.   °¨ Drink 2-3 glasses of water. Dehydration may cause these contractions.   °¨ Do slow and deep breathing several times an hour.   °WHEN SHOULD I SEEK IMMEDIATE MEDICAL CARE? °Seek immediate medical care if: °· Your contractions become stronger, more regular, and closer together.   °· You have fluid leaking or gushing from your vagina.   °· You have a fever.   °· You pass blood-tinged mucus.   °· You have vaginal bleeding.   °· You have continuous abdominal pain.   °· You have low back pain that you never had before.   °· You feel your baby's head pushing down and causing pelvic pressure.   °· Your baby is not moving as much as it used to.   °This information is not intended to replace advice given to you by your health care provider. Make sure you discuss any questions you have with your health care   provider. °Document Released: 06/30/2005 Document Revised: 10/22/2015 Document Reviewed: 04/11/2013 °Elsevier Interactive Patient Education © 2017 [employer] ° °

## 2016-09-08 NOTE — Lactation Note (Signed)
This note was copied from a baby's chart. Lactation Consultation Note  Patient Name: [name] ZOXWR'UToday's Date: 09/08/2016 Reason for consult: Initial assessment Baby at 3 hr of life. Upon entry Mom was eating. Mom reports baby is latching well. She denies breast or nipple, voiced no concerns. She has a room full of visitors and declined manual expression teaching at this visit. Discussed baby behavior, feeding frequency, baby belly size, voids, wt loss, breast changes, and nipple care. Given lactation handouts. Aware of OP services and support group. Mom will bf on demand 8+/24hr. She will express and spoon feed per volume guidelines as needed.    Maternal Data Has patient been taught Hand Expression?: No Does the patient have breastfeeding experience prior to this delivery?: No  Feeding Feeding Type: Breast Fed Length of feed: 45 min  LATCH Score/Interventions Latch: Grasps breast easily, tongue down, lips flanged, rhythmical sucking.  Audible Swallowing: Spontaneous and intermittent  Type of Nipple: Everted at rest and after stimulation  Comfort (Breast/Nipple): Soft / non-tender     Hold (Positioning): Assistance needed to correctly position infant at breast and maintain latch. Intervention(s): Breastfeeding basics reviewed;Support Pillows;Position options;Skin to skin  LATCH Score: 9  Lactation Tools Discussed/Used     Consult Status Consult Status: Follow-up Date: 09/09/16 Follow-up type: In-patient    Rulon Eisenmengerlizabeth E Gaines Cartmell 09/08/2016, 6:04 PM

## 2016-09-09 LAB — RPR: RPR Ser Ql: NONREACTIVE

## 2016-09-09 NOTE — Progress Notes (Signed)
Post Partum Day #1 Subjective: no complaints, up ad lib, voiding, tolerating PO and breast feeding is going well  Objective: Blood pressure 98/60, pulse 80, temperature 98.4 F (36.9 C), temperature source Axillary, resp. rate 18, height 5\' 4"  (1.626 m), weight 220 lb (99.8 kg), last menstrual period 12/09/2015, SpO2 99 %, unknown if currently breastfeeding.  Physical Exam:  General: alert, cooperative and no distress Lochia: appropriate Uterine Fundus: firm Incision: 1st deg, healing well DVT Evaluation: No evidence of DVT seen on physical exam. No cords or calf tenderness. No significant calf/ankle edema.   Recent Labs  09/08/16 0450  HGB 12.1  HCT 33.1*    Assessment/Plan: Plan for discharge tomorrow, Breastfeeding and Contraception Nuva Ring planned   LOS: 1 day   Roe CoombsRachelle A Denney, CNM 09/09/2016, 7:37 AM

## 2016-09-09 NOTE — Lactation Note (Signed)
This note was copied from a baby's chart. Lactation Consultation Note  Patient Name: Tammie Mckee ZOXWR'UToday's Date: 09/09/2016 Reason for consult: Follow-up assessment  Baby is 25 hours old and last good feeding was at 1100 for 20 mins per mom / and swallows noted. @ this consult , mom undressed baby  , changed a large wet diaper, and LC assisted with latch and baby very sleepy , and did not seem interested. LC recommended STS until baby wakes up for a feeding. Also at this attempted hand expressed small drops and swabbed baby's lips.  LC reassured  Mom baby is probably taking a breast due to being up all night and feeding several times, and last feeding was at 1100 for 20 mins. Voids and stools QS for age.     Maternal Data Has patient been taught Hand Expression?: Yes  Feeding Feeding Type: Breast Fed Length of feed:  (baby sleepy, mom wearing STS , )  LATCH Score/Interventions Latch: Too sleepy or reluctant, no latch achieved, no sucking elicited. (baby sleepy ) Intervention(s): Skin to skin;Teach feeding cues;Waking techniques  Audible Swallowing: None Intervention(s): Skin to skin;Hand expression  Type of Nipple: Everted at rest and after stimulation  Comfort (Breast/Nipple): Soft / non-tender     Hold (Positioning): Assistance needed to correctly position infant at breast and maintain latch. Intervention(s): Breastfeeding basics reviewed;Support Pillows;Position options;Skin to skin  LATCH Score: 5  Lactation Tools Discussed/Used Tools: Pump (per MBU RN - requested set up with a DEBP to start pumping ) Breast pump type: Double-Electric Breast Pump WIC Program: Yes Pump Review: Setup, frequency, and cleaning;Other (comment) (by [facility] ) Initiated by::  (by [facility] )   Consult Status Consult Status: Follow-up Date: 09/10/16 Follow-up type: In-patient    Tammie Mckee 09/09/2016, 4:11 PM

## 2016-09-09 NOTE — Anesthesia Preprocedure Evaluation (Signed)
Anesthesia Evaluation  Patient identified by MRN, date of birth, ID band Patient awake    Reviewed: Allergy & Precautions, H&P , NPO status , Patient's Chart, lab work & pertinent test results  Airway Mallampati: II  TM Distance: >3 FB Neck ROM: full    Dental no notable dental hx.    Pulmonary neg pulmonary ROS,    Pulmonary exam normal breath sounds clear to auscultation       Cardiovascular negative cardio ROS Normal cardiovascular exam Rhythm:regular Rate:Normal     Neuro/Psych negative neurological ROS  negative psych ROS   GI/Hepatic negative GI ROS, Neg liver ROS,   Endo/Other  negative endocrine ROS  Renal/GU negative Renal ROS     Musculoskeletal   Abdominal (+) + obese,   Peds  Hematology negative hematology ROS (+)   Anesthesia Other Findings   Reproductive/Obstetrics (+) Pregnancy                             Anesthesia Physical Anesthesia Plan  ASA: II  Anesthesia Plan: Epidural   Post-op Pain Management:    Induction:   Airway Management Planned:   Additional Equipment:   Intra-op Plan:   Post-operative Plan:   Informed Consent: I have reviewed the patients History and Physical, chart, labs and discussed the procedure including the risks, benefits and alternatives for the proposed anesthesia with the patient or authorized representative who has indicated his/her understanding and acceptance.     Plan Discussed with:   Anesthesia Plan Comments:         Anesthesia Quick Evaluation

## 2016-09-10 MED ORDER — IBUPROFEN 600 MG PO TABS: 600.0000 mg | ORAL_TABLET | Freq: Four times a day (QID) | ORAL | 2 refills | Status: AC

## 2016-09-10 NOTE — Discharge Summary (Signed)
OB Discharge Summary     Patient Name: Tammie Mckee DOB: 1995/12/06 MRN: [id number]  Date of admission: 09/08/2016 Delivering MD: Tammie Mckee   Date of discharge: 09/10/2016  Admitting diagnosis: 39 WEEKS CTX Intrauterine pregnancy: 3152w1d     Secondary diagnosis:  Active Problems:   Indication for care in labor or delivery  Additional problems: None     Discharge diagnosis: Term Pregnancy Delivered                                                                                                Post partum procedures:none  Augmentation: AROM  Complications: None  Hospital course:  Onset of Labor With Vaginal Delivery     21 y.o. yo G2P1011 at 152w1d was admitted in Active Labor on 09/08/2016. Patient had an uncomplicated labor course as follows:  Membrane Rupture Time/Date: 10:05 AM ,09/08/2016   Intrapartum Procedures: Episiotomy: None [1]                                         Lacerations:  1st degree [2];Periurethral [8]  Patient had a delivery of a Viable infant. 09/08/2016  Information for the patient's newborn:  Tammie Mckee [[id number]][[id number]]  Delivery Method: Vaginal, Spontaneous Delivery (Filed from Delivery Summary)    Pateint had an uncomplicated postpartum course.  She is ambulating, tolerating a regular diet, passing flatus, and urinating well. Patient is discharged home in stable condition on 09/10/16.   Physical exam  Vitals:   09/08/16 2200 09/09/16 0613 09/09/16 1903 09/10/16 0554  BP: 109/66 98/60 122/60 112/60  Pulse: 85 80 77 73  Resp: 20 18 18 18   Temp: 98 F (36.7 C) 98.4 F (36.9 C) 97.9 F (36.6 C) 97.9 F (36.6 C)  TempSrc: Oral Axillary Axillary Axillary  SpO2: 99% 99%    Weight:      Height:       General: alert, cooperative and no distress Lochia: appropriate Uterine Fundus: firm Incision: N/A DVT Evaluation: No evidence of DVT seen on physical exam. No cords or calf tenderness. No significant calf/ankle edema. Labs: Lab  Results  Component Value Date   WBC 13.9 (H) 09/08/2016   HGB 12.1 09/08/2016   HCT 33.1 (L) 09/08/2016   MCV 82.1 09/08/2016   PLT 215 09/08/2016   CMP Latest Ref Rng & Units 03/25/2013  Glucose 70 - 99 mg/dL 782(N101(H)  BUN 6 - 23 mg/dL 8  Creatinine 5.620.50 - 1.301.10 mg/dL 8.650.73  Sodium 784135 - 696145 mEq/L 134(L)  Potassium 3.5 - 5.1 mEq/L 3.2(L)  Chloride 96 - 112 mEq/L 101  CO2 19 - 32 mEq/L 23  Calcium 8.4 - 10.5 mg/dL 8.9    Discharge instruction: per After Visit Summary and "Baby and Me Booklet".  After visit meds:  Allergies as of 09/10/2016   No Known Allergies     Medication List    TAKE these medications   ibuprofen 600 MG tablet Commonly known as:  ADVIL,MOTRIN Take 1 tablet (600 mg  total) by mouth every 6 (six) hours.       Diet: routine diet  Activity: Advance as tolerated. Pelvic rest for 6 weeks.   Outpatient follow up:4 weeks Follow up Appt:No future appointments. Follow up Visit:No Follow-up on file.  Postpartum contraception: Paediatric nurse  Newborn Data: Live born female  Birth Weight: 6 lb 9 oz (2977 g) APGAR: 9,   Baby Feeding: Breast Disposition:home with mother   09/10/2016 Roe Coombs, CNM

## 2016-09-10 NOTE — Progress Notes (Signed)
Post Partum Day #2 Subjective: no complaints, up ad lib, voiding and tolerating PO  Objective: Blood pressure 112/60, pulse 73, temperature 97.9 F (36.6 C), temperature source Axillary, resp. rate 18, height 5\' 4"  (1.626 m), weight 220 lb (99.8 kg), last menstrual period 12/09/2015, SpO2 99 %, unknown if currently breastfeeding.  Physical Exam:  General: alert, cooperative and no distress Lochia: appropriate Uterine Fundus: firm Incision: 1st deg; healing well DVT Evaluation: No evidence of DVT seen on physical exam. No cords or calf tenderness. No significant calf/ankle edema.   Recent Labs  09/08/16 0450  HGB 12.1  HCT 33.1*    Assessment/Plan: Discharge home, Breastfeeding and Contraception undecided   LOS: 2 days   Roe CoombsRachelle A Deserai Cansler, CNM 09/10/2016, 7:46 AM

## 2016-09-11 ENCOUNTER — Encounter: Payer: Medicaid Other | Admitting: Certified Nurse Midwife

## 2016-10-06 ENCOUNTER — Encounter: Payer: Self-pay | Admitting: Obstetrics and Gynecology

## 2016-10-06 ENCOUNTER — Ambulatory Visit (INDEPENDENT_AMBULATORY_CARE_PROVIDER_SITE_OTHER): Payer: Medicaid Other | Admitting: Obstetrics and Gynecology

## 2016-10-06 MED ORDER — ETONOGESTREL-ETHINYL ESTRADIOL 0.12-0.015 MG/24HR VA RING: VAGINAL_RING | VAGINAL | 12 refills | Status: AC

## 2016-10-06 MED ORDER — DOCUSATE SODIUM 100 MG PO CAPS: 100.0000 mg | ORAL_CAPSULE | Freq: Two times a day (BID) | ORAL | 2 refills | Status: AC | PRN

## 2016-10-06 NOTE — Progress Notes (Signed)
  Subjective:     Tammie Mckee is a 21 y.o. female who presents for a postpartum visit. She is 4 weeks postpartum following a spontaneous vaginal delivery. I have fully reviewed the prenatal and intrapartum course. The delivery was at 39 gestational weeks. Outcome: spontaneous vaginal delivery. Anesthesia: epidural. Postpartum course has been UNREMARKABLE. Baby's course has been UREMARKABLE. Baby is feeding by both breast and bottle - Enfamil AR. Bleeding no bleeding. Bowel function is abnormal: constipated. Bladder function is normal. Patient is not sexually active. Contraception method is abstinence. Postpartum depression screening: negative.     Review of Systems Pertinent items are noted in HPI.   Objective:    BP (!) 125/57   Pulse 73   Temp 97.4 F (36.3 C)   Ht 5\' 3"  (1.6 m)   Wt 211 lb (95.7 kg)   Breastfeeding? Yes   BMI 37.38 kg/m   General:  alert, cooperative and no distress   Breasts:  inspection negative, no nipple discharge or bleeding, no masses or nodularity palpable  Lungs: clear to auscultation bilaterally  Heart:  regular rate and rhythm  Abdomen: soft, non-tender; bowel sounds normal; no masses,  no organomegaly   Vulva:  normal  Vagina: normal vagina, no discharge, exudate, lesion, or erythema  Cervix:  multiparous appearance  Corpus: normal size, contour, position, consistency, mobility, non-tender  Adnexa:  normal adnexa and no mass, fullness, tenderness  Rectal Exam: Not performed.        Assessment:     Normal postpartum exam. Pap smear not done at today's visit.   Plan:    1. Contraception: Nexplanon 2. Patient is medically cleared to resume all activities of daily living 3. Follow up in: August for annual exam or as needed.

## 2016-12-12 NOTE — Addendum Note (Signed)
Addendum  created 12/12/16 1041 by Sara Selvidge D, MD   Sign clinical note
